# Patient Record
Sex: Female | Born: 1970
Health system: Southern US, Community
[De-identification: ages and names within clinical notes are randomized; demographics above are authoritative.]

## PROBLEM LIST (undated history)

## (undated) DIAGNOSIS — E669 Obesity, unspecified: Secondary | ICD-10-CM

## (undated) DIAGNOSIS — K219 Gastro-esophageal reflux disease without esophagitis: Secondary | ICD-10-CM

## (undated) DIAGNOSIS — C189 Malignant neoplasm of colon, unspecified: Secondary | ICD-10-CM

## (undated) DIAGNOSIS — F419 Anxiety disorder, unspecified: Secondary | ICD-10-CM

## (undated) DIAGNOSIS — R911 Solitary pulmonary nodule: Secondary | ICD-10-CM

## (undated) HISTORY — DX: Malignant neoplasm of colon, unspecified: C18.9

## (undated) HISTORY — PX: TUBAL LIGATION: SHX77

## (undated) HISTORY — PX: WISDOM TOOTH EXTRACTION: SHX21

## (undated) HISTORY — PX: COLONOSCOPY: SHX174

---

## 1998-07-26 ENCOUNTER — Ambulatory Visit (HOSPITAL_COMMUNITY): Admission: RE | Admit: 1998-07-26 | Discharge: 1998-07-26 | Payer: Self-pay | Admitting: *Deleted

## 1999-04-24 ENCOUNTER — Other Ambulatory Visit: Admission: RE | Admit: 1999-04-24 | Discharge: 1999-04-24 | Payer: Self-pay | Admitting: *Deleted

## 1999-09-04 ENCOUNTER — Other Ambulatory Visit: Admission: RE | Admit: 1999-09-04 | Discharge: 1999-09-04 | Payer: Self-pay | Admitting: Gynecology

## 2000-03-16 ENCOUNTER — Other Ambulatory Visit: Admission: RE | Admit: 2000-03-16 | Discharge: 2000-03-16 | Payer: Self-pay | Admitting: Obstetrics & Gynecology

## 2000-05-17 ENCOUNTER — Ambulatory Visit (HOSPITAL_COMMUNITY): Admission: RE | Admit: 2000-05-17 | Discharge: 2000-05-17 | Payer: Self-pay | Admitting: *Deleted

## 2000-05-17 ENCOUNTER — Encounter: Payer: Self-pay | Admitting: *Deleted

## 2000-06-14 ENCOUNTER — Ambulatory Visit (HOSPITAL_COMMUNITY): Admission: RE | Admit: 2000-06-14 | Discharge: 2000-06-14 | Payer: Self-pay | Admitting: *Deleted

## 2000-06-14 ENCOUNTER — Encounter: Payer: Self-pay | Admitting: *Deleted

## 2000-07-12 ENCOUNTER — Encounter: Payer: Self-pay | Admitting: Obstetrics and Gynecology

## 2000-07-12 ENCOUNTER — Ambulatory Visit (HOSPITAL_COMMUNITY): Admission: RE | Admit: 2000-07-12 | Discharge: 2000-07-12 | Payer: Self-pay | Admitting: Obstetrics and Gynecology

## 2000-08-10 ENCOUNTER — Ambulatory Visit (HOSPITAL_COMMUNITY): Admission: RE | Admit: 2000-08-10 | Discharge: 2000-08-10 | Payer: Self-pay | Admitting: *Deleted

## 2000-08-10 ENCOUNTER — Encounter: Payer: Self-pay | Admitting: *Deleted

## 2000-09-07 ENCOUNTER — Encounter (HOSPITAL_COMMUNITY): Admission: RE | Admit: 2000-09-07 | Discharge: 2000-09-27 | Payer: Self-pay | Admitting: *Deleted

## 2000-09-07 ENCOUNTER — Encounter: Payer: Self-pay | Admitting: *Deleted

## 2000-09-16 ENCOUNTER — Inpatient Hospital Stay (HOSPITAL_COMMUNITY): Admission: AD | Admit: 2000-09-16 | Discharge: 2000-09-16 | Payer: Self-pay | Admitting: *Deleted

## 2000-09-24 ENCOUNTER — Inpatient Hospital Stay (HOSPITAL_COMMUNITY): Admission: AD | Admit: 2000-09-24 | Discharge: 2000-09-29 | Payer: Self-pay | Admitting: Obstetrics and Gynecology

## 2001-07-12 ENCOUNTER — Other Ambulatory Visit: Admission: RE | Admit: 2001-07-12 | Discharge: 2001-07-12 | Payer: Self-pay | Admitting: *Deleted

## 2002-07-17 ENCOUNTER — Other Ambulatory Visit: Admission: RE | Admit: 2002-07-17 | Discharge: 2002-07-17 | Payer: Self-pay | Admitting: *Deleted

## 2003-07-16 ENCOUNTER — Other Ambulatory Visit: Admission: RE | Admit: 2003-07-16 | Discharge: 2003-07-16 | Payer: Self-pay | Admitting: *Deleted

## 2004-09-16 ENCOUNTER — Other Ambulatory Visit: Admission: RE | Admit: 2004-09-16 | Discharge: 2004-09-16 | Payer: Self-pay | Admitting: Obstetrics and Gynecology

## 2006-07-21 ENCOUNTER — Other Ambulatory Visit: Admission: RE | Admit: 2006-07-21 | Discharge: 2006-07-21 | Payer: Self-pay | Admitting: Obstetrics and Gynecology

## 2007-01-23 ENCOUNTER — Inpatient Hospital Stay (HOSPITAL_COMMUNITY): Admission: AD | Admit: 2007-01-23 | Discharge: 2007-01-23 | Payer: Self-pay | Admitting: Obstetrics and Gynecology

## 2007-02-17 ENCOUNTER — Inpatient Hospital Stay (HOSPITAL_COMMUNITY): Admission: AD | Admit: 2007-02-17 | Discharge: 2007-02-20 | Payer: Self-pay | Admitting: Obstetrics and Gynecology

## 2014-06-19 ENCOUNTER — Other Ambulatory Visit: Payer: Self-pay | Admitting: Obstetrics and Gynecology

## 2014-06-19 DIAGNOSIS — Z1231 Encounter for screening mammogram for malignant neoplasm of breast: Secondary | ICD-10-CM

## 2014-06-27 ENCOUNTER — Other Ambulatory Visit: Payer: Self-pay | Admitting: Obstetrics and Gynecology

## 2014-06-27 ENCOUNTER — Ambulatory Visit: Payer: Self-pay

## 2014-07-03 ENCOUNTER — Ambulatory Visit
Admission: RE | Admit: 2014-07-03 | Discharge: 2014-07-03 | Disposition: A | Discharge status: Home / Self Care | Payer: BC Managed Care – PPO | Source: Ambulatory Visit | Attending: Obstetrics and Gynecology | Admitting: Obstetrics and Gynecology

## 2014-07-03 ENCOUNTER — Encounter (INDEPENDENT_AMBULATORY_CARE_PROVIDER_SITE_OTHER): Payer: Self-pay

## 2014-07-03 DIAGNOSIS — Z1231 Encounter for screening mammogram for malignant neoplasm of breast: Secondary | ICD-10-CM

## 2016-07-13 DIAGNOSIS — Z6836 Body mass index (BMI) 36.0-36.9, adult: Secondary | ICD-10-CM | POA: Diagnosis not present

## 2016-07-13 DIAGNOSIS — Z01419 Encounter for gynecological examination (general) (routine) without abnormal findings: Secondary | ICD-10-CM | POA: Diagnosis not present

## 2016-07-13 DIAGNOSIS — Z1231 Encounter for screening mammogram for malignant neoplasm of breast: Secondary | ICD-10-CM | POA: Diagnosis not present

## 2016-09-11 DIAGNOSIS — Z23 Encounter for immunization: Secondary | ICD-10-CM | POA: Diagnosis not present

## 2017-07-14 DIAGNOSIS — Z1231 Encounter for screening mammogram for malignant neoplasm of breast: Secondary | ICD-10-CM | POA: Diagnosis not present

## 2017-07-14 DIAGNOSIS — Z01419 Encounter for gynecological examination (general) (routine) without abnormal findings: Secondary | ICD-10-CM | POA: Diagnosis not present

## 2017-07-14 DIAGNOSIS — R102 Pelvic and perineal pain: Secondary | ICD-10-CM | POA: Diagnosis not present

## 2017-07-14 DIAGNOSIS — N926 Irregular menstruation, unspecified: Secondary | ICD-10-CM | POA: Diagnosis not present

## 2017-09-08 DIAGNOSIS — Z23 Encounter for immunization: Secondary | ICD-10-CM | POA: Diagnosis not present

## 2017-12-29 DIAGNOSIS — R109 Unspecified abdominal pain: Secondary | ICD-10-CM | POA: Diagnosis not present

## 2017-12-30 DIAGNOSIS — N882 Stricture and stenosis of cervix uteri: Secondary | ICD-10-CM | POA: Diagnosis not present

## 2017-12-30 DIAGNOSIS — N939 Abnormal uterine and vaginal bleeding, unspecified: Secondary | ICD-10-CM | POA: Diagnosis not present

## 2017-12-30 DIAGNOSIS — N926 Irregular menstruation, unspecified: Secondary | ICD-10-CM | POA: Diagnosis not present

## 2018-02-04 DIAGNOSIS — N882 Stricture and stenosis of cervix uteri: Secondary | ICD-10-CM | POA: Diagnosis not present

## 2018-02-04 DIAGNOSIS — N926 Irregular menstruation, unspecified: Secondary | ICD-10-CM | POA: Diagnosis not present

## 2018-02-04 DIAGNOSIS — N939 Abnormal uterine and vaginal bleeding, unspecified: Secondary | ICD-10-CM | POA: Diagnosis not present

## 2018-02-07 ENCOUNTER — Other Ambulatory Visit: Payer: Self-pay | Admitting: Obstetrics and Gynecology

## 2018-02-07 DIAGNOSIS — N939 Abnormal uterine and vaginal bleeding, unspecified: Secondary | ICD-10-CM | POA: Diagnosis not present

## 2018-02-17 DIAGNOSIS — N882 Stricture and stenosis of cervix uteri: Secondary | ICD-10-CM | POA: Diagnosis not present

## 2018-02-17 DIAGNOSIS — N926 Irregular menstruation, unspecified: Secondary | ICD-10-CM | POA: Diagnosis not present

## 2018-02-17 DIAGNOSIS — N939 Abnormal uterine and vaginal bleeding, unspecified: Secondary | ICD-10-CM | POA: Diagnosis not present

## 2018-05-24 DIAGNOSIS — N939 Abnormal uterine and vaginal bleeding, unspecified: Secondary | ICD-10-CM | POA: Diagnosis not present

## 2018-05-24 DIAGNOSIS — N920 Excessive and frequent menstruation with regular cycle: Secondary | ICD-10-CM | POA: Diagnosis not present

## 2018-07-19 DIAGNOSIS — Z6837 Body mass index (BMI) 37.0-37.9, adult: Secondary | ICD-10-CM | POA: Diagnosis not present

## 2018-07-19 DIAGNOSIS — N939 Abnormal uterine and vaginal bleeding, unspecified: Secondary | ICD-10-CM | POA: Diagnosis not present

## 2018-07-19 DIAGNOSIS — Z01419 Encounter for gynecological examination (general) (routine) without abnormal findings: Secondary | ICD-10-CM | POA: Diagnosis not present

## 2018-09-01 DIAGNOSIS — Z23 Encounter for immunization: Secondary | ICD-10-CM | POA: Diagnosis not present

## 2018-09-07 DIAGNOSIS — Z1231 Encounter for screening mammogram for malignant neoplasm of breast: Secondary | ICD-10-CM | POA: Diagnosis not present

## 2019-08-04 DIAGNOSIS — Z6835 Body mass index (BMI) 35.0-35.9, adult: Secondary | ICD-10-CM | POA: Diagnosis not present

## 2019-08-04 DIAGNOSIS — Z124 Encounter for screening for malignant neoplasm of cervix: Secondary | ICD-10-CM | POA: Diagnosis not present

## 2019-08-04 DIAGNOSIS — Z1211 Encounter for screening for malignant neoplasm of colon: Secondary | ICD-10-CM | POA: Diagnosis not present

## 2019-08-04 DIAGNOSIS — Z1239 Encounter for other screening for malignant neoplasm of breast: Secondary | ICD-10-CM | POA: Diagnosis not present

## 2019-08-04 DIAGNOSIS — Z01419 Encounter for gynecological examination (general) (routine) without abnormal findings: Secondary | ICD-10-CM | POA: Diagnosis not present

## 2019-09-06 DIAGNOSIS — Z23 Encounter for immunization: Secondary | ICD-10-CM | POA: Diagnosis not present

## 2019-09-11 DIAGNOSIS — Z1231 Encounter for screening mammogram for malignant neoplasm of breast: Secondary | ICD-10-CM | POA: Diagnosis not present

## 2019-10-12 DIAGNOSIS — E669 Obesity, unspecified: Secondary | ICD-10-CM | POA: Diagnosis not present

## 2019-10-12 DIAGNOSIS — K625 Hemorrhage of anus and rectum: Secondary | ICD-10-CM | POA: Diagnosis not present

## 2019-10-12 DIAGNOSIS — K219 Gastro-esophageal reflux disease without esophagitis: Secondary | ICD-10-CM | POA: Diagnosis not present

## 2019-10-12 DIAGNOSIS — Z1211 Encounter for screening for malignant neoplasm of colon: Secondary | ICD-10-CM | POA: Diagnosis not present

## 2020-01-19 DIAGNOSIS — K625 Hemorrhage of anus and rectum: Secondary | ICD-10-CM | POA: Diagnosis not present

## 2020-01-19 DIAGNOSIS — C187 Malignant neoplasm of sigmoid colon: Secondary | ICD-10-CM | POA: Diagnosis not present

## 2020-01-19 DIAGNOSIS — D125 Benign neoplasm of sigmoid colon: Secondary | ICD-10-CM | POA: Diagnosis not present

## 2020-01-19 DIAGNOSIS — D122 Benign neoplasm of ascending colon: Secondary | ICD-10-CM | POA: Diagnosis not present

## 2020-01-19 DIAGNOSIS — K5730 Diverticulosis of large intestine without perforation or abscess without bleeding: Secondary | ICD-10-CM | POA: Diagnosis not present

## 2020-01-19 DIAGNOSIS — K635 Polyp of colon: Secondary | ICD-10-CM | POA: Diagnosis not present

## 2020-01-19 DIAGNOSIS — Z1211 Encounter for screening for malignant neoplasm of colon: Secondary | ICD-10-CM | POA: Diagnosis not present

## 2020-02-06 DIAGNOSIS — Z86010 Personal history of colonic polyps: Secondary | ICD-10-CM | POA: Diagnosis not present

## 2020-02-06 DIAGNOSIS — K5730 Diverticulosis of large intestine without perforation or abscess without bleeding: Secondary | ICD-10-CM | POA: Diagnosis not present

## 2020-02-20 DIAGNOSIS — C187 Malignant neoplasm of sigmoid colon: Secondary | ICD-10-CM | POA: Diagnosis not present

## 2020-02-22 ENCOUNTER — Other Ambulatory Visit: Payer: Self-pay | Admitting: Surgery

## 2020-02-22 DIAGNOSIS — C187 Malignant neoplasm of sigmoid colon: Secondary | ICD-10-CM

## 2020-02-29 ENCOUNTER — Other Ambulatory Visit: Payer: Self-pay | Admitting: Surgery

## 2020-02-29 DIAGNOSIS — C187 Malignant neoplasm of sigmoid colon: Secondary | ICD-10-CM

## 2020-03-08 ENCOUNTER — Other Ambulatory Visit: Payer: Self-pay

## 2020-03-08 ENCOUNTER — Ambulatory Visit
Admission: RE | Admit: 2020-03-08 | Discharge: 2020-03-08 | Disposition: A | Discharge status: Home / Self Care | Payer: BC Managed Care – PPO | Source: Ambulatory Visit | Attending: Surgery | Admitting: Surgery

## 2020-03-08 DIAGNOSIS — C187 Malignant neoplasm of sigmoid colon: Secondary | ICD-10-CM

## 2020-03-08 DIAGNOSIS — D259 Leiomyoma of uterus, unspecified: Secondary | ICD-10-CM | POA: Diagnosis not present

## 2020-03-08 DIAGNOSIS — C189 Malignant neoplasm of colon, unspecified: Secondary | ICD-10-CM | POA: Diagnosis not present

## 2020-03-08 DIAGNOSIS — R911 Solitary pulmonary nodule: Secondary | ICD-10-CM | POA: Diagnosis not present

## 2020-03-08 MED ORDER — IOPAMIDOL (ISOVUE-300) INJECTION 61%
100.0000 mL | Freq: Once | INTRAVENOUS | Status: AC | PRN
  Administered 2020-03-08: 100 mL via INTRAVENOUS

## 2020-03-21 ENCOUNTER — Other Ambulatory Visit: Payer: Self-pay | Admitting: Surgery

## 2020-03-21 ENCOUNTER — Other Ambulatory Visit (HOSPITAL_COMMUNITY): Payer: Self-pay | Admitting: Surgery

## 2020-03-21 DIAGNOSIS — C187 Malignant neoplasm of sigmoid colon: Secondary | ICD-10-CM

## 2020-03-22 ENCOUNTER — Telehealth: Payer: Self-pay | Admitting: Hematology

## 2020-03-22 NOTE — Telephone Encounter (Signed)
Received a new pt referral from Dr. Dema Severin at [facility] for colon cancer. Amanda Bernard has been cld and scheduled to see Dr. Burr Medico on 4/22 at 3pm. Pt aware to arrive 15 minutes early.   Tc to Kim at [facility] to obtain colonoscopy, path and other info received from Dr. Lorie Apley office. Maudie Mercury will fax over the information.

## 2020-03-25 ENCOUNTER — Other Ambulatory Visit: Payer: Self-pay | Admitting: Surgery

## 2020-03-25 DIAGNOSIS — C187 Malignant neoplasm of sigmoid colon: Secondary | ICD-10-CM

## 2020-03-25 NOTE — Progress Notes (Signed)
Spoke with patient introduced myself as GI nurse navigator and explained my role.  She is aware of her upcoming appointment with Dr. Burr Medico on 4/22 at 3:00 pm.  I asked her to arrive at least 15 minutes early for the registration process.  I told her she is allowed to bring one support person with her to her appointment. She is having a PET scan at 1:00 pm prior to seeing Dr. Burr Medico.  She is very worried at this point as she was told there is something in her lung.  Her husband died about 18 years ago with lung cancer.  At that time they had twin babies at home.  I explained to her the PET scan will let us know if there is any cancer anywhere else and I will meet with her at her appointment on Thursday.  She verbalized an understanding.

## 2020-03-27 ENCOUNTER — Ambulatory Visit (HOSPITAL_COMMUNITY)
Admission: RE | Admit: 2020-03-27 | Discharge: 2020-03-27 | Disposition: A | Discharge status: Home / Self Care | Payer: BC Managed Care – PPO | Source: Ambulatory Visit | Attending: Surgery | Admitting: Surgery

## 2020-03-27 ENCOUNTER — Other Ambulatory Visit: Payer: Self-pay

## 2020-03-27 DIAGNOSIS — C187 Malignant neoplasm of sigmoid colon: Secondary | ICD-10-CM | POA: Insufficient documentation

## 2020-03-27 LAB — GLUCOSE, CAPILLARY: Glucose-Capillary: 125 mg/dL — ABNORMAL HIGH (ref 70–99)

## 2020-03-27 MED ORDER — FLUDEOXYGLUCOSE F - 18 (FDG) INJECTION
10.4600 | Freq: Once | INTRAVENOUS | Status: AC | PRN
  Administered 2020-03-27: 10.46 via INTRAVENOUS

## 2020-03-28 ENCOUNTER — Inpatient Hospital Stay: Payer: BC Managed Care – PPO | Attending: Hematology | Admitting: Hematology

## 2020-03-28 ENCOUNTER — Encounter: Payer: Self-pay | Admitting: Hematology

## 2020-03-28 ENCOUNTER — Inpatient Hospital Stay: Payer: BC Managed Care – PPO | Admitting: Hematology

## 2020-03-28 ENCOUNTER — Inpatient Hospital Stay: Payer: BC Managed Care – PPO

## 2020-03-28 ENCOUNTER — Other Ambulatory Visit: Payer: Self-pay

## 2020-03-28 ENCOUNTER — Encounter (HOSPITAL_COMMUNITY): Admission: RE | Admit: 2020-03-28 | Payer: BC Managed Care – PPO | Source: Ambulatory Visit

## 2020-03-28 DIAGNOSIS — C187 Malignant neoplasm of sigmoid colon: Secondary | ICD-10-CM | POA: Diagnosis not present

## 2020-03-28 DIAGNOSIS — Z87891 Personal history of nicotine dependence: Secondary | ICD-10-CM | POA: Diagnosis not present

## 2020-03-28 DIAGNOSIS — R911 Solitary pulmonary nodule: Secondary | ICD-10-CM | POA: Insufficient documentation

## 2020-03-28 LAB — CEA (IN HOUSE-CHCC): CEA (CHCC-In House): <1 ng/mL (ref 0.00–5.00)

## 2020-03-28 NOTE — Progress Notes (Signed)
Met with patient at her initial medical oncology appointment with Dr. Burr Medico.  I had spoken with her previously on the phone.  She was given my business card with my direct phone number and encouraged to call with any questions or concerns.  I explained my role as GI nurse navigator.  Her questions were answered.

## 2020-03-28 NOTE — Progress Notes (Signed)
[facility]   [phone] Fax:([phone]) [place] Note   Patient Care Team: Patient, No Pcp Per as PCP - General (General Practice) Jonnie Finner, RN as Oncology Nurse Navigator (Oncology) Truitt Merle, MD as Consulting Physician (Hematology)  Date of Service:  03/28/2020   CHIEF COMPLAINTS/PURPOSE OF CONSULTATION:  Newly Diagnosed Sigmoid colon cancer and right lung nodule   REFERRING PHYSICIAN:  Dr Collene Mares  Oncology History Overview Note  Cancer Staging Cancer of sigmoid colon [facility]) Staging form: Colon and Rectum, AJCC 8th Edition - Clinical stage from 02/13/2020: Stage I (cT1, cN0, cM0) - Signed by Truitt Merle, MD on 03/28/2020    Cancer of sigmoid colon ([place])  01/19/2020 Procedure   Colonoscopy by Dr Collene Mares 01/19/20 IMPRESSION -One 26mm polyp in the sigmoid colon, removed with a hot snare X2 after injecting the stalk with 4cc of epinephrine, resected and retrieved. 2 Clips were placed ot control post polypectomy bleeding.  -One small polyp in the proximal ascending colon was removed by cold biopsies.  -few small diverticula in the sigmoid colon.    01/19/2020 Initial Biopsy   Final Diagnoses 01/19/20 A. Colon, Ascending, Polyp, Polypectomy:  -FRAGMENTS OF TUBULAR ADENOMA.   -NO HIGH GRADE DYSPLASIA OR MALIGNANCY B. Colon, sigmoid, polyp, Polupectomy:   -INVASIVE ADENOCARCINOMA, WELL DIFFERENTIATES, ARISING IN AN ADENOMA. See comment.    02/13/2020 Cancer Staging   Staging form: Colon and Rectum, AJCC 8th Edition - Clinical stage from 02/13/2020: Stage I (cT1, cN0, cM0) - Signed by Truitt Merle, MD on 03/28/2020   03/08/2020 Imaging   CT CAP w contrast 03/08/20 IMPRESSION: 1. 15 mm right lower lobe pulmonary nodule is indeterminate. A solitary pulmonary metastatic focus is possible but probably unlikely. PET-CT versus follow-up chest CT in 3-4 months. 2. No mediastinal or hilar mass or adenopathy. 3. No obvious colon lesion. 4. No findings  for metastatic disease involving the abdomen/pelvis. 5. Uterine fibroid. 6. Bilateral pars defects at L5 with a grade 1 spondylolisthesis. There also appears to be a ununited right pedicle fracture at L5.   03/27/2020 PET scan   IMPRESSION: 1. The right lower lobe lung nodule has low-grade metabolic activity about the level of the blood pool, maximum SUV 3.0. Low-grade malignancy such as neuroendocrine tumor not excluded. 2. No significant activity in the sigmoid colon region of the liver, or retroperitoneum. Given this lack of evidence of spread, I am skeptical that the right lower lobe nodule is due to metastatic disease from the sigmoid colon lesion. 3. Other imaging findings of potential clinical significance: Mild chronic left maxillary sinusitis. Aortic Atherosclerosis (ICD10-I70.0). 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   03/28/2020 Initial Diagnosis   Cancer of sigmoid colon ([place])      HISTORY OF PRESENTING ILLNESS:  Amanda Bernard 49 y.o. female is a here because of newly diagnosed sigmoid colon cancer. The patient was referred by Dr Collene Mares. The patient presents to the clinic today alone.   Her cancer was found by her initial screening colonoscopy. She notes rare rectal bleeding over he past several years. She denies pain with BM and had stable regular BM. She denies abdominal bloating. She did have chronic acid reflex since her first pregnancy. She notes she is eating adequately and has stable weight. She denied vision, cough, SOB, chest pain, or LE edema or significant pain. She notes she has  been anxious lately about her diagnosis.   Socially has a long term significant other with a set of twins and a teenager. She works as an Optometrist. She drinks at least 2 beer a day, about 20 cans in a week. She quit smoking 9 years ago after smoking for  25 years 1-1.5ppd. She still uses Vape with Nicoteine occasionally.   She does not have any significant medical history. Her only surgery is C-sections. She notes her maternal grandmother had ovarian cancer in early 104s. Her paternal grandfather had lung cancer and her paternal uncle had brain cancer. Her son had testicular cancer at 42 months old.  She notes she had a hard fall on her coccyx when she was 12 and had concern if her lumbar spine fracture was related to this or if this was genetic. She will only have lower back pain with standing or walking for long time. She notes her periods or sporadic from monthly to several months.     REVIEW OF SYSTEMS:    Constitutional: Denies fevers, chills or abnormal night sweats Eyes: Denies blurriness of vision, double vision or watery eyes Ears, nose, mouth, throat, and face: Denies mucositis or sore throat Respiratory: Denies cough, dyspnea or wheezes Cardiovascular: Denies palpitation, chest discomfort or lower extremity swelling Gastrointestinal:  Denies nausea, heartburn or change in bowel habits Skin: Denies abnormal skin rashes Lymphatics: Denies new lymphadenopathy or easy bruising Neurological:Denies numbness, tingling or new weaknesses Behavioral/Psych: Mood is stable, no new changes  All other systems were reviewed with the patient and are negative.   MEDICAL HISTORY:  History reviewed. No pertinent past medical history.  SURGICAL HISTORY: History reviewed. No pertinent surgical history.  SOCIAL HISTORY: Social History   Socioeconomic History  . Marital status: Widowed    Spouse name: Not on file  . Number of children: 3  . Years of education: Not on file  . Highest education level: Not on file  Occupational History  . Not on file  Tobacco Use  . Smoking status: Former Smoker    Packs/day: 1.00    Years: 25.00    Pack years: 25.00    Quit date: 12/07/2009    Years since quitting: 10.3  . Smokeless tobacco: Current User    Substance and Sexual Activity  . Alcohol use: Yes    Alcohol/week: 20.0 standard drinks    Types: 20 Cans of beer per week    Comment: for 3 years   . Drug use: Not on file  . Sexual activity: Yes  Other Topics Concern  . Not on file  Social History Narrative  . Not on file   Social Determinants of Health   Financial Resource Strain:   . Difficulty of Paying Living Expenses:   Food Insecurity:   . Worried About Charity fundraiser in the Last Year:   . Arboriculturist in the Last Year:   Transportation Needs:   . Film/video editor (Medical):   Marland Kitchen Lack of Transportation (Non-Medical):   Physical Activity:   . Days of Exercise per Week:   . Minutes of Exercise per Session:   Stress:   . Feeling of Stress :   Social Connections:   . Frequency of Communication with Friends and Family:   . Frequency of Social Gatherings with Friends and Family:   . Attends Religious Services:   . Active Member of Clubs or Organizations:   . Attends Archivist Meetings:   Marland Kitchen Marital Status:  Intimate Partner Violence:   . Fear of Current or Ex-Partner:   . Emotionally Abused:   Marland Kitchen Physically Abused:   . Sexually Abused:     FAMILY HISTORY: Family History  Problem Relation Age of Onset  . Cancer Paternal Uncle        brain cancer   . Cancer Maternal Grandmother 40       ovarian cancer   . Cancer Paternal Grandfather        lung cancer  . Cancer Child 1       testicular cancer     ALLERGIES:  has no allergies on file.  MEDICATIONS:  Current Outpatient Medications  Medication Sig Dispense Refill  . B Complex Vitamins (VITAMIN B COMPLEX 100 IJ) vitamin B complex    . Calcium Carb-Cholecalciferol (CALCIUM 1000 + D PO) Calcium With Vitamin D3    . ibuprofen (ADVIL) 600 MG tablet ibuprofen 600 mg tablet    . influenza vac split quadrivalent PF (FLUARIX) 0.5 ML injection Afluria Quad 2018-2019 (PF) 60 mcg (15 mcg x 4)/0.5 mL IM syringe  ADM 0.5ML IM UTD    . influenza  vaccine (FLUCELVAX QUADRIVALENT) 0.5 ML injection Flucelvax Quad 2020-2021 (PF) 60 mcg (15 mcg x 4)/0.5 mL IM syringe  ADM 0.5ML IM UTD    . Loratadine (CLARITIN) 10 MG CAPS Claritin    . Multiple Vitamins-Minerals (HAIR SKIN AND NAILS FORMULA PO) Hair,Skin and Nails    . Multiple Vitamins-Minerals (MULTIVITAMIN ADULT EXTRA C PO) multivitamin    . Omega-3 Fatty Acids (FISH OIL) 1000 MG CAPS Fish Oil    . Omeprazole (PRILOSEC PO) omeprazole    . Probiotic Product (PROBIOTIC-10 PO) Probiotic    . triamcinolone ointment (KENALOG) 0.5 % triamcinolone acetonide 0.5 % topical ointment     No current facility-administered medications for this visit.    PHYSICAL EXAMINATION: ECOG PERFORMANCE STATUS: 0 - Asymptomatic  Vitals:   03/28/20 1224  BP: (!) 172/99  Pulse: 69  Resp: 18  Temp: 98 F (36.7 C)  SpO2: 100%   Filed Weights   03/28/20 1224  Weight: 215 lb 4.8 oz (97.7 kg)    GENERAL:alert, no distress and comfortable SKIN: skin color, texture, turgor are normal, no rashes or significant lesions EYES: normal, Conjunctiva are pink and non-injected, sclera clear  NECK: supple, thyroid normal size, non-tender, without nodularity LYMPH:  no palpable lymphadenopathy in the cervical, axillary  LUNGS: clear to auscultation and percussion with normal breathing effort HEART: regular rate & rhythm and no murmurs and no lower extremity edema ABDOMEN:abdomen soft, non-tender and normal bowel sounds Musculoskeletal:no cyanosis of digits and no clubbing  NEURO: alert & oriented x 3 with fluent speech, no focal motor/sensory deficits  LABORATORY DATA:  I have reviewed the data as listed No flowsheet data found.  No flowsheet data found.   RADIOGRAPHIC STUDIES: I have personally reviewed the radiological images as listed and agreed with the findings in the report. CT CHEST W CONTRAST  Result Date: 03/08/2020 CLINICAL DATA:  Recent diagnosis of cancerous colon polyp at colonoscopy. Staging  examination. EXAM: CT CHEST, ABDOMEN, AND PELVIS WITH CONTRAST TECHNIQUE: Multidetector CT imaging of the chest, abdomen and pelvis was performed following the standard protocol during bolus administration of intravenous contrast. CONTRAST:  182mL ISOVUE-300 IOPAMIDOL (ISOVUE-300) INJECTION 61% COMPARISON:  None. FINDINGS: CT CHEST FINDINGS Cardiovascular: The heart is normal in size. No pericardial effusion. The aorta is normal in caliber. No dissection. No atherosclerotic calcifications. No definite coronary artery calcifications.  Mediastinum/Nodes: No mediastinal or hilar mass or lymphadenopathy. The esophagus is grossly normal. Lungs/Pleura: There is a smoothly marginated but slightly lobulated 15 mm nodule at the right lung base which is an indeterminate finding but could not exclude a solitary pulmonary metastasis. I do not see any other pulmonary nodules or worrisome pulmonary lesions. There is a streaky band of right middle lobe atelectasis medially. No infiltrates or effusions. The tracheobronchial tree is unremarkable. Musculoskeletal: No breast masses, supraclavicular or axillary adenopathy. The thyroid gland appears normal except for a small calcification. The bony thorax is intact. No worrisome bone lesions. CT ABDOMEN PELVIS FINDINGS Hepatobiliary: No hepatic lesions to suggest hepatic metastatic disease. The gallbladder is normal. No common bile duct dilatation. Pancreas: No mass, inflammation or ductal dilatation. Spleen: Normal size. No focal lesions. Adrenals/Urinary Tract: The adrenal glands and kidneys are unremarkable. No renal, ureteral or bladder calculi or mass. Stomach/Bowel: The stomach, duodenum, small bowel and colon are unremarkable. No acute inflammatory changes, mass lesions or obstructive findings. The terminal ileum is normal. Vascular/Lymphatic: The aorta is normal in caliber. No dissection. The branch vessels are patent. The major venous structures are patent. No mesenteric or  retroperitoneal mass or adenopathy. Small scattered lymph nodes are noted. Reproductive: Mild retroflexion of the uterus. A posterior myometrial fibroid is noted. The ovaries are unremarkable. Other: No pelvic mass or adenopathy. No free pelvic fluid collections. No inguinal mass or adenopathy. No abdominal wall hernia or subcutaneous lesions. Musculoskeletal: Bilateral pars defects are noted at L5 with a grade 1 spondylolisthesis. There also appears to be a ununited right pedicle fracture at L5. No worrisome bone lesions. IMPRESSION: 1. 15 mm right lower lobe pulmonary nodule is indeterminate. A solitary pulmonary metastatic focus is possible but probably unlikely. PET-CT versus follow-up chest CT in 3-4 months. 2. No mediastinal or hilar mass or adenopathy. 3. No obvious colon lesion. 4. No findings for metastatic disease involving the abdomen/pelvis. 5. Uterine fibroid. 6. Bilateral pars defects at L5 with a grade 1 spondylolisthesis. There also appears to be a ununited right pedicle fracture at L5. Electronically Signed   By: Marijo Sanes M.D.   On: 03/08/2020 16:25   CT ABDOMEN PELVIS W CONTRAST  Result Date: 03/08/2020 CLINICAL DATA:  Recent diagnosis of cancerous colon polyp at colonoscopy. Staging examination. EXAM: CT CHEST, ABDOMEN, AND PELVIS WITH CONTRAST TECHNIQUE: Multidetector CT imaging of the chest, abdomen and pelvis was performed following the standard protocol during bolus administration of intravenous contrast. CONTRAST:  150mL ISOVUE-300 IOPAMIDOL (ISOVUE-300) INJECTION 61% COMPARISON:  None. FINDINGS: CT CHEST FINDINGS Cardiovascular: The heart is normal in size. No pericardial effusion. The aorta is normal in caliber. No dissection. No atherosclerotic calcifications. No definite coronary artery calcifications. Mediastinum/Nodes: No mediastinal or hilar mass or lymphadenopathy. The esophagus is grossly normal. Lungs/Pleura: There is a smoothly marginated but slightly lobulated 15 mm nodule  at the right lung base which is an indeterminate finding but could not exclude a solitary pulmonary metastasis. I do not see any other pulmonary nodules or worrisome pulmonary lesions. There is a streaky band of right middle lobe atelectasis medially. No infiltrates or effusions. The tracheobronchial tree is unremarkable. Musculoskeletal: No breast masses, supraclavicular or axillary adenopathy. The thyroid gland appears normal except for a small calcification. The bony thorax is intact. No worrisome bone lesions. CT ABDOMEN PELVIS FINDINGS Hepatobiliary: No hepatic lesions to suggest hepatic metastatic disease. The gallbladder is normal. No common bile duct dilatation. Pancreas: No mass, inflammation or ductal dilatation. Spleen: Normal  size. No focal lesions. Adrenals/Urinary Tract: The adrenal glands and kidneys are unremarkable. No renal, ureteral or bladder calculi or mass. Stomach/Bowel: The stomach, duodenum, small bowel and colon are unremarkable. No acute inflammatory changes, mass lesions or obstructive findings. The terminal ileum is normal. Vascular/Lymphatic: The aorta is normal in caliber. No dissection. The branch vessels are patent. The major venous structures are patent. No mesenteric or retroperitoneal mass or adenopathy. Small scattered lymph nodes are noted. Reproductive: Mild retroflexion of the uterus. A posterior myometrial fibroid is noted. The ovaries are unremarkable. Other: No pelvic mass or adenopathy. No free pelvic fluid collections. No inguinal mass or adenopathy. No abdominal wall hernia or subcutaneous lesions. Musculoskeletal: Bilateral pars defects are noted at L5 with a grade 1 spondylolisthesis. There also appears to be a ununited right pedicle fracture at L5. No worrisome bone lesions. IMPRESSION: 1. 15 mm right lower lobe pulmonary nodule is indeterminate. A solitary pulmonary metastatic focus is possible but probably unlikely. PET-CT versus follow-up chest CT in 3-4 months. 2.  No mediastinal or hilar mass or adenopathy. 3. No obvious colon lesion. 4. No findings for metastatic disease involving the abdomen/pelvis. 5. Uterine fibroid. 6. Bilateral pars defects at L5 with a grade 1 spondylolisthesis. There also appears to be a ununited right pedicle fracture at L5. Electronically Signed   By: Marijo Sanes M.D.   On: 03/08/2020 16:25   NM PET Image Initial (PI) Skull Base To Thigh  Result Date: 03/27/2020 CLINICAL DATA:  Initial treatment strategy for sigmoid colon cancer. Lung nodule on prior CT. EXAM: NUCLEAR MEDICINE PET SKULL BASE TO THIGH TECHNIQUE: 10.5 mCi F-18 FDG was injected intravenously. Full-ring PET imaging was performed from the skull base to thigh after the radiotracer. CT data was obtained and used for attenuation correction and anatomic localization. Fasting blood glucose: 125 mg/dl COMPARISON:  CT scan 03/08/2020 FINDINGS: Mediastinal blood pool activity: SUV max 3.0 Liver activity: SUV max N/A NECK: Accentuated activity in the palatine tonsils without appreciable associated CT abnormality, maximum SUV 8.8 on the right and 5.9 the left. Physiologic glottic activity in physiologic activity at the tongue base. Incidental CT findings: Mild chronic left maxillary sinusitis. CHEST: Thin left axillary lymph node 5 mm in parenchymal thickness, maximum SUV 5.0. The patient received COVID vaccination in the left arm last week and this is likely the cause for this mildly accentuated axillary nodal activity. The 1.6 cm right basilar pulmonary nodule has only low-grade activity with a maximum SUV of 3.0. Incidental CT findings: Unremarkable ABDOMEN/PELVIS: No hypermetabolic activity in the sigmoid colon is identified. No hypermetabolic adenopathy, hypermetabolic liver lesion, or other specific findings for metastatic disease in the abdomen/pelvis. Incidental CT findings: Mild abdominal aortic atherosclerotic calcification. Retroverted uterus. SKELETON: No significant abnormal  hypermetabolic activity in this region. 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 There 9 mm of anterolisthesis at L4-5 associated with a chronic left-sided L4 pars defect, and a chronic nonunited fracture or failure of ossification involving the right L4 pedicle. Endplate sclerosis at 075-GRM. IMPRESSION: 1. The right lower lobe lung nodule has low-grade metabolic activity about the level of the blood pool, maximum SUV 3.0. Low-grade malignancy such as neuroendocrine tumor not excluded. 2. No significant activity in the sigmoid colon region of the liver, or retroperitoneum. Given this lack of evidence of spread, I am skeptical that the right lower lobe nodule is due to metastatic disease from the sigmoid colon lesion. 3. Other imaging findings of potential clinical significance:  Mild chronic left maxillary sinusitis. Aortic Atherosclerosis (ICD10-I70.0). 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 Electronically Signed   By: Van Clines M.D.   On: 03/27/2020 12:36    ASSESSMENT & PLAN:  Amanda Bernard is a 50 y.o. Caucasian female with   1. Sigmoid Colon Cancer, cT2N0M0, Stage I -I personally reviewed her colonoscopy report, CT and PET image and colonosocpy biopsy results with her in great detail. Images reviewed in person with pt. She was found to have a 3cm polyp in sigmoid colon during her initial screening colonoscopy. It was removed and path showed well differentiated invasive adenocarcinoma involving submucusa.margins were negative (closest 53mm). Her CT scan showed no LN involvement or other definitive evidence of metastasis except an indeterminate 1.5cm RLL lung nodeule .  -I discussed her polypectomy was dose in multiple pieces, but by PET showed no indication of uptake or residual disease in that area.  -I discussed standard care  for stage I colon cancer is surgery to remove her sigmoid colon and surrounding LNs which I recommend.  -Given her early stage disease,negative margins and negative PET there is a low risk of residual microscopic disease or future recurence. Forgoing surgery to proceed with cancer surveillance is also an option with repeated scans and Colonoscopy for close monitoring.  -I will review her case with GI Tumor Board next week about lung biopsy and colon surgery. I answered all her questions to her understanding and satisfaction.  -Will obtain tumor Marker for baseline today. Physical exam negative today  -F/u as needed if she will f/u with Dr. Dema Severin or Collene Mares    2. Former smoker, Right Lung Nodule  -She quit smoking 9 years ago after smoking for 25 years 1-1.5ppd. She still uses Vape with Nicoteine occasionally.  -I have reviewed health risks and complications from smoking and discussed and advised complete smoking cessation.  -Her CT and PET scan in 03/2020 showed 1.5cm right lower lobe nodule with low grade metabolic activity. The lesion is solid and round, not spiculate, I have low suspicion this is related to her colon cancer. At this size, her lung nodule could be slow growing tumor such as neuroendocrine tumor or benign nodule, but there is no prior scan to compare. We can do repeat CT chest to monitor this in 3-6 months.  -I discussed biopsy could give definitive diagnosis, but the location may not be easy for biopsy. I will consult IR or pulmonologist about this. She is interested in biopsy.    3. Genetic Testing, Cancer Screening  -She has a family history of ovarian cancer (MGM in 33s), lung cancer (PGF), brain cancer (Pat, Uncle), testicular cancer (Her son at 44 months old).  -I will check with our genetic Counselor to see if her Genetic testing could be covered.  -I also encouraged her to continue routine Pap smear and yearly mammograms    PLAN:  -Baseline labs today for CEA -GI and  thoracic tumor board discussion  -F/u open    Orders Placed This Encounter  Procedures  . [facility] (IN HOUSE-[facility])    Standing Status:   Future    Number of Occurrences:   1    Standing Expiration Date:   03/28/2021    All questions were answered. The patient knows to call the clinic with any problems, questions or concerns. The total time spent in the appointment was 50 minutes.  Truitt Merle, MD 03/28/2020 11:03 PM  I, Joslyn Devon, am acting as scribe for Truitt Merle, MD.   I have reviewed the above documentation for accuracy and completeness, and I agree with the above.

## 2020-03-29 ENCOUNTER — Telehealth: Payer: Self-pay | Admitting: Hematology

## 2020-03-29 ENCOUNTER — Telehealth: Payer: Self-pay

## 2020-03-29 NOTE — Telephone Encounter (Signed)
Dr. Ernestina Penna noted dated 03/28/2020 faxed to Dr. Collene Mares at [phone]

## 2020-03-29 NOTE — Telephone Encounter (Signed)
No 4/22 los. No changes made to pt's schedule.  

## 2020-04-03 ENCOUNTER — Other Ambulatory Visit: Payer: Self-pay

## 2020-04-04 ENCOUNTER — Encounter: Payer: Self-pay | Admitting: *Deleted

## 2020-04-04 ENCOUNTER — Other Ambulatory Visit: Payer: Self-pay | Admitting: *Deleted

## 2020-04-04 ENCOUNTER — Other Ambulatory Visit: Payer: Self-pay

## 2020-04-04 DIAGNOSIS — R911 Solitary pulmonary nodule: Secondary | ICD-10-CM

## 2020-04-04 DIAGNOSIS — C187 Malignant neoplasm of sigmoid colon: Secondary | ICD-10-CM

## 2020-04-04 NOTE — Progress Notes (Signed)
I contacted Dr. Juline Patch assistant that patient has been referred to him for bx via bronchoscopy.

## 2020-04-04 NOTE — Progress Notes (Signed)
Spoke with patient per Dr. Burr Medico to let her know she was discussed in thoracic tumor board this morning.  The recommendation is to biopsy the lung nodule.  She has made a referral to Dr. Valeta Harms at [facility] to have this done.  She verbalized an understanding and will await to hear from his office.

## 2020-04-04 NOTE — Progress Notes (Signed)
The proposed treatment discussed in cancer conference 04/04/20 is for discussion purpose only and is not a binding recommendation.  The patient was not physically examined nor present for their treatment options.  Therefore, final treatment plans cannot be decided.  

## 2020-04-11 DIAGNOSIS — H6123 Impacted cerumen, bilateral: Secondary | ICD-10-CM | POA: Diagnosis not present

## 2020-04-17 ENCOUNTER — Encounter: Payer: Self-pay | Admitting: Pulmonary Disease

## 2020-04-17 ENCOUNTER — Other Ambulatory Visit: Payer: Self-pay

## 2020-04-17 ENCOUNTER — Telehealth: Payer: Self-pay | Admitting: Pulmonary Disease

## 2020-04-17 ENCOUNTER — Ambulatory Visit: Payer: BC Managed Care – PPO | Admitting: Pulmonary Disease

## 2020-04-17 VITALS — BP 142/82 | HR 69 | Temp 96.2°F | Ht 65.5 in | Wt 213.8 lb

## 2020-04-17 DIAGNOSIS — R942 Abnormal results of pulmonary function studies: Secondary | ICD-10-CM | POA: Diagnosis not present

## 2020-04-17 DIAGNOSIS — R911 Solitary pulmonary nodule: Secondary | ICD-10-CM

## 2020-04-17 DIAGNOSIS — C187 Malignant neoplasm of sigmoid colon: Secondary | ICD-10-CM | POA: Diagnosis not present

## 2020-04-17 NOTE — Progress Notes (Signed)
Synopsis: Referred in May 2021 for pulmonary nodule by Garner Nash, DO  Subjective:   PATIENT ID: Amanda Bernard GENDER: female DOB: May 14, 1971, MRN: [id number]  Chief Complaint  Patient presents with  . Consult    This is a 49 year old female, past medical history of sigmoid colon cancer and newly diagnosed right lung nodule.  Patient underwent biopsies and February 2021 which revealed an invasive adenocarcinoma.  Patient's images were reviewed at medical thoracic oncology conference and decision was made for biopsy of the lower lobe lung nodule.  It did have low-level SUV uptake on PET imaging.  Patient was referred to pulmonary for navigational bronchoscopy evaluation.  OV 04/17/2020: Patient with no respiratory complaints today.  Little anxious about everything that has been going on recently.  Is aware that she was referred here for bronchoscopy and biopsy.  We discussed this today in the office with detail.  Patient is a non-smoker.   Past Medical History:  Diagnosis Date  . Colon cancer [facility])      Family History  Problem Relation Age of Onset  . Cancer Paternal Uncle        brain cancer   . Cancer Maternal Grandmother 40       ovarian cancer   . Cancer Paternal Grandfather        lung cancer  . Cancer Child 1       testicular cancer      History reviewed. No pertinent surgical history.  Social History   Socioeconomic History  . Marital status: Widowed    Spouse name: Not on file  . Number of children: 3  . Years of education: Not on file  . Highest education level: Not on file  Occupational History  . Not on file  Tobacco Use  . Smoking status: Former Smoker    Packs/day: 1.00    Years: 25.00    Pack years: 25.00    Quit date: 12/07/2009    Years since quitting: 10.3  . Smokeless tobacco: Current User  Substance and Sexual Activity  . Alcohol use: Yes    Alcohol/week: 20.0 standard drinks    Types: 20 Cans of beer per week    Comment: for 3 years    . Drug use: Not on file  . Sexual activity: Yes  Other Topics Concern  . Not on file  Social History Narrative  . Not on file   Social Determinants of Health   Financial Resource Strain:   . Difficulty of Paying Living Expenses:   Food Insecurity:   . Worried About Charity fundraiser in the Last Year:   . Arboriculturist in the Last Year:   Transportation Needs:   . Film/video editor (Medical):   Marland Kitchen Lack of Transportation (Non-Medical):   Physical Activity:   . Days of Exercise per Week:   . Minutes of Exercise per Session:   Stress:   . Feeling of Stress :   Social Connections:   . Frequency of Communication with Friends and Family:   . Frequency of Social Gatherings with Friends and Family:   . Attends Religious Services:   . Active Member of Clubs or Organizations:   . Attends Archivist Meetings:   Marland Kitchen Marital Status:   Intimate Partner Violence:   . Fear of Current or Ex-Partner:   . Emotionally Abused:   Marland Kitchen Physically Abused:   . Sexually Abused:      No Known Allergies  Outpatient Medications Prior to Visit  Medication Sig Dispense Refill  . B Complex Vitamins (VITAMIN B COMPLEX 100 IJ) vitamin B complex    . Calcium Carb-Cholecalciferol (CALCIUM 1000 + D PO) Calcium With Vitamin D3    . ibuprofen (ADVIL) 600 MG tablet ibuprofen 600 mg tablet    . influenza vaccine (FLUCELVAX QUADRIVALENT) 0.5 ML injection Flucelvax Quad 2020-2021 (PF) 60 mcg (15 mcg x 4)/0.5 mL IM syringe  ADM 0.5ML IM UTD    . Loratadine (CLARITIN) 10 MG CAPS Claritin    . Multiple Vitamins-Minerals (HAIR SKIN AND NAILS FORMULA PO) Hair,Skin and Nails    . Multiple Vitamins-Minerals (MULTIVITAMIN ADULT EXTRA C PO) multivitamin    . Omega-3 Fatty Acids (FISH OIL) 1000 MG CAPS Fish Oil    . Probiotic Product (PROBIOTIC-10 PO) Probiotic    . triamcinolone ointment (KENALOG) 0.5 % triamcinolone acetonide 0.5 % topical ointment    . influenza vac split quadrivalent PF (FLUARIX)  0.5 ML injection Afluria Quad 2018-2019 (PF) 60 mcg (15 mcg x 4)/0.5 mL IM syringe  ADM 0.5ML IM UTD    . Omeprazole (PRILOSEC PO) omeprazole     No facility-administered medications prior to visit.    Review of Systems  Constitutional: Negative for chills, fever, malaise/fatigue and weight loss.  HENT: Negative for hearing loss, sore throat and tinnitus.   Eyes: Negative for blurred vision and double vision.  Respiratory: Negative for cough, hemoptysis, sputum production, shortness of breath, wheezing and stridor.   Cardiovascular: Negative for chest pain, palpitations, orthopnea, leg swelling and PND.  Gastrointestinal: Negative for abdominal pain, constipation, diarrhea, heartburn, nausea and vomiting.  Genitourinary: Negative for dysuria, hematuria and urgency.  Musculoskeletal: Negative for joint pain and myalgias.  Skin: Negative for itching and rash.  Neurological: Negative for dizziness, tingling, weakness and headaches.  Endo/Heme/Allergies: Negative for environmental allergies. Does not bruise/bleed easily.  Psychiatric/Behavioral: Negative for depression. The patient is not nervous/anxious and does not have insomnia.   All other systems reviewed and are negative.    Objective:  Physical Exam Vitals reviewed.  Constitutional:      General: She is not in acute distress.    Appearance: She is well-developed.  HENT:     Head: Normocephalic and atraumatic.  Eyes:     General: No scleral icterus.    Conjunctiva/sclera: Conjunctivae normal.     Pupils: Pupils are equal, round, and reactive to light.  Neck:     Vascular: No JVD.     Trachea: No tracheal deviation.  Cardiovascular:     Rate and Rhythm: Normal rate and regular rhythm.     Heart sounds: Normal heart sounds. No murmur.  Pulmonary:     Effort: Pulmonary effort is normal. No tachypnea, accessory muscle usage or respiratory distress.     Breath sounds: Normal breath sounds. No stridor. No wheezing, rhonchi or  rales.  Abdominal:     General: Bowel sounds are normal. There is no distension.     Palpations: Abdomen is soft.     Tenderness: There is no abdominal tenderness.  Musculoskeletal:        General: No tenderness.     Cervical back: Neck supple.  Lymphadenopathy:     Cervical: No cervical adenopathy.  Skin:    General: Skin is warm and dry.     Capillary Refill: Capillary refill takes less than 2 seconds.     Findings: No rash.  Neurological:     Mental Status: She is alert and oriented  to person, place, and time.  Psychiatric:        Behavior: Behavior normal.      Vitals:   04/17/20 1023  BP: (!) 142/82  Pulse: 69  Temp: (!) 96.2 F (35.7 C)  TempSrc: Temporal  SpO2: 100%  Weight: 213 lb 12.8 oz (97 kg)  Height: 5' 5.5" (1.664 m)   100% on RA BMI Readings from Last 3 Encounters:  04/17/20 35.04 kg/m  03/28/20 35.28 kg/m   Wt Readings from Last 3 Encounters:  04/17/20 213 lb 12.8 oz (97 kg)  03/28/20 215 lb 4.8 oz (97.7 kg)     CBC No results found for: WBC, RBC, HGB, HCT, PLT, MCV, MCH, MCHC, RDW, LYMPHSABS, MONOABS, EOSABS, BASOSABS  Chest Imaging: Medicine pet imaging April 2021: Low-level PET uptake between patient's right lower lobe pulmonary nodule SUV 3.0 possible underlying malignancy. The patient's images have been independently reviewed by me.    Pulmonary Functions Testing Results: No flowsheet data found.  FeNO: none  Pathology: none  Echocardiogram: none  Heart Catheterization: none    Assessment & Plan:     ICD-10-CM   1. Lung nodule  R91.1 CT Super D Chest Wo Contrast    Ambulatory referral to Pulmonology  2. Cancer of sigmoid colon ([place])  C18.7   3. Abnormal PET of right lung  R94.2     Discussion: This is a 49 year old female recent diagnosis of adenocarcinoma sigmoid colon.  Chest imaging with right lower lobe nodule.  CT scan and PET scan reviewed.  Low-level PET uptake with SUV of 3.  This is concerning for potential  underlying malignancy of the right lower lobe of the lung.  Assessment:   New diagnosis right lower lobe pulmonary nodule concerning for underlying malignancy abnormal PET scan of the lung. Need to rule out metastasis of colon which would be unlikely.  This is potentially a low-grade neuroendocrine tumor based on demographic, non-smoker as well as low level PET activity.  Plan Following Extensive Data Review & Interpretation:  . I reviewed prior external note(s) from Dr. Burr Medico med onc 03/28/2020 . I reviewed the result(s) of colon pathology, adenocarcinoma  . I have ordered non contrasted SuperD CT chest for bronch planning   Independent interpretation of tests . Review of patient's 03/27/2020 nuclear medicine PET images revealed right lower lobe low-grade metabolic activity SUV 3.. The patient's images have been independently reviewed by me.    Discussion of management or test interpretation with Dr. Burr Medico, Medical oncology   We will schedule for video bronchoscopy with navigation.  Plans for this on 04/23/2020. Today in the office we discussed risk benefits alternatives of proceeding with video bronchoscopy and biopsy. We discussed transthoracic needle biopsy and risk of pneumothorax related to this versus bronchoscopy risk of pneumothorax.  Additionally we discussed risk of bleeding.  Patient is agreeable to proceed with bronchoscopy as scheduled.  She will also need a super D noncontrasted CT.  I have ordered this stat.  To be completed prior to patient's bronchoscopy.    Current Outpatient Medications:  .  B Complex Vitamins (VITAMIN B COMPLEX 100 IJ), vitamin B complex, Disp: , Rfl:  .  Calcium Carb-Cholecalciferol (CALCIUM 1000 + D PO), Calcium With Vitamin D3, Disp: , Rfl:  .  ibuprofen (ADVIL) 600 MG tablet, ibuprofen 600 mg tablet, Disp: , Rfl:  .  influenza vaccine (FLUCELVAX QUADRIVALENT) 0.5 ML injection, Flucelvax Quad 2020-2021 (PF) 60 mcg (15 mcg x 4)/0.5 mL IM syringe  ADM  0.5ML IM UTD, Disp: , Rfl:  .  Loratadine (CLARITIN) 10 MG CAPS, Claritin, Disp: , Rfl:  .  Multiple Vitamins-Minerals (HAIR SKIN AND NAILS FORMULA PO), Hair,Skin and Nails, Disp: , Rfl:  .  Multiple Vitamins-Minerals (MULTIVITAMIN ADULT EXTRA C PO), multivitamin, Disp: , Rfl:  .  Omega-3 Fatty Acids (FISH OIL) 1000 MG CAPS, Fish Oil, Disp: , Rfl:  .  Probiotic Product (PROBIOTIC-10 PO), Probiotic, Disp: , Rfl:  .  triamcinolone ointment (KENALOG) 0.5 %, triamcinolone acetonide 0.5 % topical ointment, Disp: , Rfl:  .  Omeprazole (PRILOSEC PO), omeprazole, Disp: , Rfl:    Garner Nash, DO [facility] 04/17/2020 10:54 AM

## 2020-04-17 NOTE — Patient Instructions (Signed)
Thank you for visiting Dr. Valeta Harms at [facility]. Today we recommend the following:  Orders Placed This Encounter  Procedures  . CT Super D Chest Wo Contrast  . Ambulatory referral to Pulmonology   Nothing by mouth the night before past midnight   Return in about 2 weeks (around 05/01/2020) for with APP,  w/ Tammy Parrett.    Please do your part to reduce the spread of COVID-19.

## 2020-04-17 NOTE — Telephone Encounter (Signed)
PCCM: Thanks Garner Nash, DO [facility] 04/17/2020 5:16 PM

## 2020-04-17 NOTE — Telephone Encounter (Signed)
And sending it over tomorrow

## 2020-04-17 NOTE — H&P (View-Only) (Signed)
Synopsis: Referred in May 2021 for pulmonary nodule by Garner Nash, DO  Subjective:   PATIENT ID: Amanda Bernard GENDER: female DOB: [birth year], MRN: [id number]  Chief Complaint  Patient presents with  . Consult    This is a 49 year old female, past medical history of sigmoid colon cancer and newly diagnosed right lung nodule.  Patient underwent biopsies and February 2021 which revealed an invasive adenocarcinoma.  Patient's images were reviewed at medical thoracic oncology conference and decision was made for biopsy of the lower lobe lung nodule.  It did have low-level SUV uptake on PET imaging.  Patient was referred to pulmonary for navigational bronchoscopy evaluation.  OV 04/17/2020: Patient with no respiratory complaints today.  Little anxious about everything that has been going on recently.  Is aware that she was referred here for bronchoscopy and biopsy.  We discussed this today in the office with detail.  Patient is a non-smoker.   Past Medical History:  Diagnosis Date  . Colon cancer [facility])      Family History  Problem Relation Age of Onset  . Cancer Paternal Uncle        brain cancer   . Cancer Maternal Grandmother 40       ovarian cancer   . Cancer Paternal Grandfather        lung cancer  . Cancer Child 1       testicular cancer      History reviewed. No pertinent surgical history.  Social History   Socioeconomic History  . Marital status: Widowed    Spouse name: Not on file  . Number of children: 3  . Years of education: Not on file  . Highest education level: Not on file  Occupational History  . Not on file  Tobacco Use  . Smoking status: Former Smoker    Packs/day: 1.00    Years: 25.00    Pack years: 25.00    Quit date: 12/07/2009    Years since quitting: 10.3  . Smokeless tobacco: Current User  Substance and Sexual Activity  . Alcohol use: Yes    Alcohol/week: 20.0 standard drinks    Types: 20 Cans of beer per week    Comment: for 3 years    . Drug use: Not on file  . Sexual activity: Yes  Other Topics Concern  . Not on file  Social History Narrative  . Not on file   Social Determinants of Health   Financial Resource Strain:   . Difficulty of Paying Living Expenses:   Food Insecurity:   . Worried About Charity fundraiser in the Last Year:   . Arboriculturist in the Last Year:   Transportation Needs:   . Film/video editor (Medical):   Marland Kitchen Lack of Transportation (Non-Medical):   Physical Activity:   . Days of Exercise per Week:   . Minutes of Exercise per Session:   Stress:   . Feeling of Stress :   Social Connections:   . Frequency of Communication with Friends and Family:   . Frequency of Social Gatherings with Friends and Family:   . Attends Religious Services:   . Active Member of Clubs or Organizations:   . Attends Archivist Meetings:   Marland Kitchen Marital Status:   Intimate Partner Violence:   . Fear of Current or Ex-Partner:   . Emotionally Abused:   Marland Kitchen Physically Abused:   . Sexually Abused:      No Known Allergies  Outpatient Medications Prior to Visit  Medication Sig Dispense Refill  . B Complex Vitamins (VITAMIN B COMPLEX 100 IJ) vitamin B complex    . Calcium Carb-Cholecalciferol (CALCIUM 1000 + D PO) Calcium With Vitamin D3    . ibuprofen (ADVIL) 600 MG tablet ibuprofen 600 mg tablet    . influenza vaccine (FLUCELVAX QUADRIVALENT) 0.5 ML injection Flucelvax Quad 2020-2021 (PF) 60 mcg (15 mcg x 4)/0.5 mL IM syringe  ADM 0.5ML IM UTD    . Loratadine (CLARITIN) 10 MG CAPS Claritin    . Multiple Vitamins-Minerals (HAIR SKIN AND NAILS FORMULA PO) Hair,Skin and Nails    . Multiple Vitamins-Minerals (MULTIVITAMIN ADULT EXTRA C PO) multivitamin    . Omega-3 Fatty Acids (FISH OIL) 1000 MG CAPS Fish Oil    . Probiotic Product (PROBIOTIC-10 PO) Probiotic    . triamcinolone ointment (KENALOG) 0.5 % triamcinolone acetonide 0.5 % topical ointment    . influenza vac split quadrivalent PF (FLUARIX)  0.5 ML injection Afluria Quad 2018-2019 (PF) 60 mcg (15 mcg x 4)/0.5 mL IM syringe  ADM 0.5ML IM UTD    . Omeprazole (PRILOSEC PO) omeprazole     No facility-administered medications prior to visit.    Review of Systems  Constitutional: Negative for chills, fever, malaise/fatigue and weight loss.  HENT: Negative for hearing loss, sore throat and tinnitus.   Eyes: Negative for blurred vision and double vision.  Respiratory: Negative for cough, hemoptysis, sputum production, shortness of breath, wheezing and stridor.   Cardiovascular: Negative for chest pain, palpitations, orthopnea, leg swelling and PND.  Gastrointestinal: Negative for abdominal pain, constipation, diarrhea, heartburn, nausea and vomiting.  Genitourinary: Negative for dysuria, hematuria and urgency.  Musculoskeletal: Negative for joint pain and myalgias.  Skin: Negative for itching and rash.  Neurological: Negative for dizziness, tingling, weakness and headaches.  Endo/Heme/Allergies: Negative for environmental allergies. Does not bruise/bleed easily.  Psychiatric/Behavioral: Negative for depression. The patient is not nervous/anxious and does not have insomnia.   All other systems reviewed and are negative.    Objective:  Physical Exam Vitals reviewed.  Constitutional:      General: She is not in acute distress.    Appearance: She is well-developed.  HENT:     Head: Normocephalic and atraumatic.  Eyes:     General: No scleral icterus.    Conjunctiva/sclera: Conjunctivae normal.     Pupils: Pupils are equal, round, and reactive to light.  Neck:     Vascular: No JVD.     Trachea: No tracheal deviation.  Cardiovascular:     Rate and Rhythm: Normal rate and regular rhythm.     Heart sounds: Normal heart sounds. No murmur.  Pulmonary:     Effort: Pulmonary effort is normal. No tachypnea, accessory muscle usage or respiratory distress.     Breath sounds: Normal breath sounds. No stridor. No wheezing, rhonchi or  rales.  Abdominal:     General: Bowel sounds are normal. There is no distension.     Palpations: Abdomen is soft.     Tenderness: There is no abdominal tenderness.  Musculoskeletal:        General: No tenderness.     Cervical back: Neck supple.  Lymphadenopathy:     Cervical: No cervical adenopathy.  Skin:    General: Skin is warm and dry.     Capillary Refill: Capillary refill takes less than 2 seconds.     Findings: No rash.  Neurological:     Mental Status: She is alert and oriented  to person, place, and time.  Psychiatric:        Behavior: Behavior normal.      Vitals:   04/17/20 1023  BP: (!) 142/82  Pulse: 69  Temp: (!) 96.2 F (35.7 C)  TempSrc: Temporal  SpO2: 100%  Weight: 213 lb 12.8 oz (97 kg)  Height: 5' 5.5" (1.664 m)   100% on RA BMI Readings from Last 3 Encounters:  04/17/20 35.04 kg/m  03/28/20 35.28 kg/m   Wt Readings from Last 3 Encounters:  04/17/20 213 lb 12.8 oz (97 kg)  03/28/20 215 lb 4.8 oz (97.7 kg)     CBC No results found for: WBC, RBC, HGB, HCT, PLT, MCV, MCH, MCHC, RDW, LYMPHSABS, MONOABS, EOSABS, BASOSABS  Chest Imaging: Medicine pet imaging April 2021: Low-level PET uptake between patient's right lower lobe pulmonary nodule SUV 3.0 possible underlying malignancy. The patient's images have been independently reviewed by me.    Pulmonary Functions Testing Results: No flowsheet data found.  FeNO: none  Pathology: none  Echocardiogram: none  Heart Catheterization: none    Assessment & Plan:     ICD-10-CM   1. Lung nodule  R91.1 CT Super D Chest Wo Contrast    Ambulatory referral to Pulmonology  2. Cancer of sigmoid colon ([place])  C18.7   3. Abnormal PET of right lung  R94.2     Discussion: This is a 49 year old female recent diagnosis of adenocarcinoma sigmoid colon.  Chest imaging with right lower lobe nodule.  CT scan and PET scan reviewed.  Low-level PET uptake with SUV of 3.  This is concerning for potential  underlying malignancy of the right lower lobe of the lung.  Assessment:   New diagnosis right lower lobe pulmonary nodule concerning for underlying malignancy abnormal PET scan of the lung. Need to rule out metastasis of colon which would be unlikely.  This is potentially a low-grade neuroendocrine tumor based on demographic, non-smoker as well as low level PET activity.  Plan Following Extensive Data Review & Interpretation:  . I reviewed prior external note(s) from Dr. Burr Medico med onc 03/28/2020 . I reviewed the result(s) of colon pathology, adenocarcinoma  . I have ordered non contrasted SuperD CT chest for bronch planning   Independent interpretation of tests . Review of patient's 03/27/2020 nuclear medicine PET images revealed right lower lobe low-grade metabolic activity SUV 3.. The patient's images have been independently reviewed by me.    Discussion of management or test interpretation with Dr. Burr Medico, Medical oncology   We will schedule for video bronchoscopy with navigation.  Plans for this on 04/23/2020. Today in the office we discussed risk benefits alternatives of proceeding with video bronchoscopy and biopsy. We discussed transthoracic needle biopsy and risk of pneumothorax related to this versus bronchoscopy risk of pneumothorax.  Additionally we discussed risk of bleeding.  Patient is agreeable to proceed with bronchoscopy as scheduled.  She will also need a super D noncontrasted CT.  I have ordered this stat.  To be completed prior to patient's bronchoscopy.    Current Outpatient Medications:  .  B Complex Vitamins (VITAMIN B COMPLEX 100 IJ), vitamin B complex, Disp: , Rfl:  .  Calcium Carb-Cholecalciferol (CALCIUM 1000 + D PO), Calcium With Vitamin D3, Disp: , Rfl:  .  ibuprofen (ADVIL) 600 MG tablet, ibuprofen 600 mg tablet, Disp: , Rfl:  .  influenza vaccine (FLUCELVAX QUADRIVALENT) 0.5 ML injection, Flucelvax Quad 2020-2021 (PF) 60 mcg (15 mcg x 4)/0.5 mL IM syringe  ADM  0.5ML IM UTD, Disp: , Rfl:  .  Loratadine (CLARITIN) 10 MG CAPS, Claritin, Disp: , Rfl:  .  Multiple Vitamins-Minerals (HAIR SKIN AND NAILS FORMULA PO), Hair,Skin and Nails, Disp: , Rfl:  .  Multiple Vitamins-Minerals (MULTIVITAMIN ADULT EXTRA C PO), multivitamin, Disp: , Rfl:  .  Omega-3 Fatty Acids (FISH OIL) 1000 MG CAPS, Fish Oil, Disp: , Rfl:  .  Probiotic Product (PROBIOTIC-10 PO), Probiotic, Disp: , Rfl:  .  triamcinolone ointment (KENALOG) 0.5 %, triamcinolone acetonide 0.5 % topical ointment, Disp: , Rfl:  .  Omeprazole (PRILOSEC PO), omeprazole, Disp: , Rfl:    Garner Nash, DO [facility] 04/17/2020 10:54 AM

## 2020-04-18 ENCOUNTER — Telehealth: Payer: Self-pay | Admitting: Pulmonary Disease

## 2020-04-18 NOTE — Telephone Encounter (Signed)
Error

## 2020-04-19 ENCOUNTER — Telehealth: Payer: Self-pay | Admitting: Internal Medicine

## 2020-04-19 ENCOUNTER — Other Ambulatory Visit (INDEPENDENT_AMBULATORY_CARE_PROVIDER_SITE_OTHER): Payer: BC Managed Care – PPO

## 2020-04-19 ENCOUNTER — Other Ambulatory Visit (HOSPITAL_COMMUNITY)
Admission: RE | Admit: 2020-04-19 | Discharge: 2020-04-19 | Disposition: A | Discharge status: Home / Self Care | Payer: BC Managed Care – PPO | Source: Ambulatory Visit | Attending: Pulmonary Disease | Admitting: Pulmonary Disease

## 2020-04-19 DIAGNOSIS — Z20822 Contact with and (suspected) exposure to covid-19: Secondary | ICD-10-CM | POA: Diagnosis not present

## 2020-04-19 DIAGNOSIS — Z01812 Encounter for preprocedural laboratory examination: Secondary | ICD-10-CM | POA: Insufficient documentation

## 2020-04-19 DIAGNOSIS — R911 Solitary pulmonary nodule: Secondary | ICD-10-CM

## 2020-04-19 DIAGNOSIS — R942 Abnormal results of pulmonary function studies: Secondary | ICD-10-CM

## 2020-04-19 LAB — COMPREHENSIVE METABOLIC PANEL
ALT: 16 U/L (ref 0–35)
AST: 16 U/L (ref 0–37)
Albumin: 4 g/dL (ref 3.5–5.2)
Alkaline Phosphatase: 88 U/L (ref 39–117)
BUN: 9 mg/dL (ref 6–23)
CO2: 28 mEq/L (ref 19–32)
Calcium: 8.9 mg/dL (ref 8.4–10.5)
Chloride: 104 mEq/L (ref 96–112)
Creatinine, Ser: 0.64 mg/dL (ref 0.40–1.20)
GFR: 97.93 mL/min (ref >60.00)
Glucose, Bld: 110 mg/dL — ABNORMAL HIGH (ref 70–99)
Potassium: 3.7 mEq/L (ref 3.5–5.1)
Sodium: 139 mEq/L (ref 135–145)
Total Bilirubin: 0.4 mg/dL (ref 0.2–1.2)
Total Protein: 7 g/dL (ref 6.0–8.3)

## 2020-04-19 LAB — CBC WITH DIFFERENTIAL/PLATELET
Basophils Absolute: 0.1 10*3/uL (ref 0.0–0.1)
Basophils Relative: 0.9 % (ref 0.0–3.0)
Eosinophils Absolute: 0.3 10*3/uL (ref 0.0–0.7)
Eosinophils Relative: 4.2 % (ref 0.0–5.0)
HCT: 43.7 % (ref 36.0–46.0)
Hemoglobin: 14.5 g/dL (ref 12.0–15.0)
Lymphocytes Relative: 36.1 % (ref 12.0–46.0)
Lymphs Abs: 2.2 10*3/uL (ref 0.7–4.0)
MCHC: 33.2 g/dL (ref 30.0–36.0)
MCV: 91.9 fl (ref 78.0–100.0)
Monocytes Absolute: 0.4 10*3/uL (ref 0.1–1.0)
Monocytes Relative: 6.8 % (ref 3.0–12.0)
Neutro Abs: 3.2 10*3/uL (ref 1.4–7.7)
Neutrophils Relative %: 52 % (ref 43.0–77.0)
Platelets: 276 10*3/uL (ref 150.0–400.0)
RBC: 4.76 Mil/uL (ref 3.87–5.11)
RDW: 14.2 % (ref 11.5–15.5)
WBC: 6.2 10*3/uL (ref 4.0–10.5)

## 2020-04-19 LAB — APTT: aPTT: 28.9 s (ref 23.4–32.7)

## 2020-04-19 LAB — PROTIME-INR
INR: 1 ratio (ref 0.8–1.0)
Prothrombin Time: 10.8 s (ref 9.6–13.1)

## 2020-04-19 LAB — SARS CORONAVIRUS 2 (TAT 6-24 HRS): SARS Coronavirus 2: NEGATIVE

## 2020-04-19 NOTE — Telephone Encounter (Signed)
CBC, CMP, PT, PTT INR  BLI

## 2020-04-19 NOTE — Telephone Encounter (Signed)
Checked pt's chart and the only lab I saw that was ordered was the covid test prior to pt's bronch.  Called and spoke with [facility] about this and she verbalized understanding. I told her that I would also reach out to pt.  Called and spoke with pt letting her know that that was the only thing I was seeing that was ordered for labs. Pt stated when she was at the office, whomever let her go after the visit had stated that she was needing to go over to [place] lab for some bloodwork. No other labs were ordered.  Dr. Valeta Harms, please advise on this if pt was needing to get some labs performed and if so, what labs?

## 2020-04-19 NOTE — Telephone Encounter (Signed)
Labs have been placed. Called and spoke with pt letting her know this and she verbalized understanding. Nothing further needed.

## 2020-04-20 ENCOUNTER — Other Ambulatory Visit (HOSPITAL_COMMUNITY): Payer: BC Managed Care – PPO

## 2020-04-22 ENCOUNTER — Other Ambulatory Visit: Payer: Self-pay

## 2020-04-22 ENCOUNTER — Encounter (HOSPITAL_COMMUNITY): Payer: Self-pay | Admitting: Pulmonary Disease

## 2020-04-22 NOTE — Progress Notes (Signed)
Pt denies SOB, chest pain, and being under the care of a cardiologist and PCP. Pt denies having a stress test, echo and cardiac cath. Pt denies having an EKG and chest x ray. Pt made aware to stop takingAspirin (unless otherwise advised by surgeon), vitamins, fish oil and herbal medications. Do not take any NSAIDs ie: Ibuprofen, Advil, Naproxen (Aleve), Motrin, BC and Goody Powder. Pt reminded to quarantine. Pt verbalized understanding of all pre-op instructions.

## 2020-04-23 ENCOUNTER — Encounter (HOSPITAL_COMMUNITY)
Admission: RE | Disposition: A | Discharge status: Home / Self Care | Payer: Self-pay | Source: Home / Self Care | Attending: Pulmonary Disease

## 2020-04-23 ENCOUNTER — Ambulatory Visit (HOSPITAL_COMMUNITY): Payer: BC Managed Care – PPO | Admitting: Anesthesiology

## 2020-04-23 ENCOUNTER — Ambulatory Visit (HOSPITAL_COMMUNITY): Payer: BC Managed Care – PPO

## 2020-04-23 ENCOUNTER — Other Ambulatory Visit: Payer: Self-pay

## 2020-04-23 ENCOUNTER — Ambulatory Visit (HOSPITAL_COMMUNITY)
Admission: RE | Admit: 2020-04-23 | Discharge: 2020-04-23 | Disposition: A | Discharge status: Home / Self Care | Payer: BC Managed Care – PPO | Attending: Pulmonary Disease | Admitting: Pulmonary Disease

## 2020-04-23 ENCOUNTER — Encounter (HOSPITAL_COMMUNITY): Payer: Self-pay | Admitting: Pulmonary Disease

## 2020-04-23 DIAGNOSIS — R911 Solitary pulmonary nodule: Secondary | ICD-10-CM

## 2020-04-23 DIAGNOSIS — Z79899 Other long term (current) drug therapy: Secondary | ICD-10-CM | POA: Insufficient documentation

## 2020-04-23 DIAGNOSIS — Z85038 Personal history of other malignant neoplasm of large intestine: Secondary | ICD-10-CM | POA: Diagnosis not present

## 2020-04-23 DIAGNOSIS — Z87891 Personal history of nicotine dependence: Secondary | ICD-10-CM | POA: Diagnosis not present

## 2020-04-23 DIAGNOSIS — E669 Obesity, unspecified: Secondary | ICD-10-CM | POA: Diagnosis not present

## 2020-04-23 DIAGNOSIS — C187 Malignant neoplasm of sigmoid colon: Secondary | ICD-10-CM | POA: Diagnosis not present

## 2020-04-23 DIAGNOSIS — K219 Gastro-esophageal reflux disease without esophagitis: Secondary | ICD-10-CM | POA: Insufficient documentation

## 2020-04-23 DIAGNOSIS — F419 Anxiety disorder, unspecified: Secondary | ICD-10-CM | POA: Diagnosis not present

## 2020-04-23 DIAGNOSIS — Z6834 Body mass index (BMI) 34.0-34.9, adult: Secondary | ICD-10-CM | POA: Diagnosis not present

## 2020-04-23 DIAGNOSIS — Z9889 Other specified postprocedural states: Secondary | ICD-10-CM | POA: Diagnosis not present

## 2020-04-23 HISTORY — PX: BRONCHIAL BIOPSY: SHX5109

## 2020-04-23 HISTORY — PX: VIDEO BRONCHOSCOPY WITH ENDOBRONCHIAL NAVIGATION: SHX6175

## 2020-04-23 HISTORY — DX: Gastro-esophageal reflux disease without esophagitis: K21.9

## 2020-04-23 HISTORY — PX: BRONCHIAL WASHINGS: SHX5105

## 2020-04-23 HISTORY — PX: BRONCHIAL BRUSHINGS: SHX5108

## 2020-04-23 HISTORY — DX: Anxiety disorder, unspecified: F41.9

## 2020-04-23 HISTORY — PX: FIDUCIAL MARKER PLACEMENT: SHX6858

## 2020-04-23 HISTORY — DX: Solitary pulmonary nodule: R91.1

## 2020-04-23 HISTORY — DX: Obesity, unspecified: E66.9

## 2020-04-23 HISTORY — PX: BRONCHIAL NEEDLE ASPIRATION BIOPSY: SHX5106

## 2020-04-23 LAB — POCT PREGNANCY, URINE: Preg Test, Ur: NEGATIVE

## 2020-04-23 SURGERY — VIDEO BRONCHOSCOPY WITH ENDOBRONCHIAL NAVIGATION
Anesthesia: General

## 2020-04-23 MED ORDER — PHENYLEPHRINE HCL (PRESSORS) 10 MG/ML IV SOLN
INTRAVENOUS | Status: DC | PRN
  Administered 2020-04-23 (×3): 120 ug via INTRAVENOUS
  Administered 2020-04-23: 200 ug via INTRAVENOUS

## 2020-04-23 MED ORDER — LACTATED RINGERS IV SOLN: INTRAVENOUS | Status: DC

## 2020-04-23 MED ORDER — ACETAMINOPHEN 500 MG PO TABS
1000.0000 mg | ORAL_TABLET | Freq: Once | ORAL | Status: AC
  Administered 2020-04-23: 1000 mg via ORAL
  Filled 2020-04-23: qty 2

## 2020-04-23 MED ORDER — OXYCODONE HCL 5 MG PO TABS: 5.0000 mg | ORAL_TABLET | Freq: Once | ORAL | Status: DC | PRN

## 2020-04-23 MED ORDER — PROPOFOL 10 MG/ML IV BOLUS
INTRAVENOUS | Status: DC | PRN
  Administered 2020-04-23: 150 mg via INTRAVENOUS

## 2020-04-23 MED ORDER — MIDAZOLAM HCL 5 MG/5ML IJ SOLN
INTRAMUSCULAR | Status: DC | PRN
  Administered 2020-04-23: 2 mg via INTRAVENOUS

## 2020-04-23 MED ORDER — HYDROMORPHONE HCL 1 MG/ML IJ SOLN: 0.2500 mg | INTRAMUSCULAR | Status: DC | PRN

## 2020-04-23 MED ORDER — SUGAMMADEX SODIUM 200 MG/2ML IV SOLN
INTRAVENOUS | Status: DC | PRN
  Administered 2020-04-23: 200 mg via INTRAVENOUS

## 2020-04-23 MED ORDER — MIDAZOLAM HCL 2 MG/2ML IJ SOLN
INTRAMUSCULAR | Status: AC
  Filled 2020-04-23: qty 2

## 2020-04-23 MED ORDER — ROCURONIUM BROMIDE 10 MG/ML (PF) SYRINGE
PREFILLED_SYRINGE | INTRAVENOUS | Status: DC | PRN
  Administered 2020-04-23: 50 mg via INTRAVENOUS
  Administered 2020-04-23: 10 mg via INTRAVENOUS

## 2020-04-23 MED ORDER — LIDOCAINE 2% (20 MG/ML) 5 ML SYRINGE
INTRAMUSCULAR | Status: DC | PRN
  Administered 2020-04-23: 50 mg via INTRAVENOUS

## 2020-04-23 MED ORDER — FENTANYL CITRATE (PF) 100 MCG/2ML IJ SOLN
INTRAMUSCULAR | Status: DC | PRN
  Administered 2020-04-23 (×2): 50 ug via INTRAVENOUS

## 2020-04-23 MED ORDER — DEXAMETHASONE SODIUM PHOSPHATE 10 MG/ML IJ SOLN
INTRAMUSCULAR | Status: DC | PRN
  Administered 2020-04-23: 4 mg via INTRAVENOUS

## 2020-04-23 MED ORDER — OXYCODONE HCL 5 MG/5ML PO SOLN: 5.0000 mg | Freq: Once | ORAL | Status: DC | PRN

## 2020-04-23 MED ORDER — ONDANSETRON HCL 4 MG/2ML IJ SOLN
INTRAMUSCULAR | Status: DC | PRN
  Administered 2020-04-23: 4 mg via INTRAVENOUS

## 2020-04-23 MED ORDER — PROMETHAZINE HCL 25 MG/ML IJ SOLN: 6.2500 mg | INTRAMUSCULAR | Status: DC | PRN

## 2020-04-23 MED ORDER — FENTANYL CITRATE (PF) 100 MCG/2ML IJ SOLN
INTRAMUSCULAR | Status: AC
  Filled 2020-04-23: qty 2

## 2020-04-23 MED ORDER — EPHEDRINE SULFATE 50 MG/ML IJ SOLN
INTRAMUSCULAR | Status: DC | PRN
  Administered 2020-04-23 (×2): 5 mg via INTRAVENOUS

## 2020-04-23 SURGICAL SUPPLY — 47 items

## 2020-04-23 NOTE — Op Note (Signed)
Video Bronchoscopy with Electromagnetic Navigation with fiducial placement procedure Note  Date of Operation: 04/23/2020  Pre-op Diagnosis: Lung nodule right lower lobe  Post-op Diagnosis: Lung nodule right lower lobe  Surgeon: Garner Nash   Assistants: none   Anesthesia: General endotracheal anesthesia  Operation: Flexible video fiberoptic bronchoscopy with electromagnetic navigation and biopsies.  Estimated Blood Loss: Minimal, <5IE  Complications: None   Indications and History: Amanda Bernard is a 49 y.o. female with right lower lobe pulmonary nodule.  The risks, benefits, complications, treatment options and expected outcomes were discussed with the patient.  The possibilities of pneumothorax, pneumonia, reaction to medication, pulmonary aspiration, perforation of a viscus, bleeding, failure to diagnose a condition and creating a complication requiring transfusion or operation were discussed with the patient who freely signed the consent.    Description of Procedure: The patient was seen in the Preoperative Area, was examined and was deemed appropriate to proceed.  The patient was taken to [facility] endoscopy room 2, identified as Amanda Bernard and the procedure verified as Flexible Video Fiberoptic Bronchoscopy.  A Time Out was held and the above information confirmed.   Prior to the date of the procedure a high-resolution CT scan of the chest was performed. Utilizing [place] a virtual tracheobronchial tree was generated to allow the creation of distinct navigation pathways to the patient's parenchymal abnormalities. After being taken to the operating room general anesthesia was initiated and the patient  was orally intubated. The video fiberoptic bronchoscope was introduced via the endotracheal tube and a general inspection was performed which showed normal right and left lung anatomy no evidence of endobronchial lesion. The extendable working channel and  locator guide were introduced into the bronchoscope. The distinct navigation pathways prepared prior to this procedure were then utilized to navigate to within 0.8 cm of patient's lesion(s) identified on CT scan.  There was a full fluoroscopic sweep for local registration completed under continuous fluoroscopy from RAO 25 to LAO 25 with an APL of 15 inspiratory breath-hold. The extendable working channel was secured into place and the locator guide was withdrawn. Under fluoroscopic guidance transbronchial needle brushings, transbronchial Wang needle biopsies, and transbronchial forceps biopsies were performed to be sent for cytology and pathology. A bronchioalveolar lavage was performed in the right lower lobe and sent for cytology.  At the end of the procedure we advanced the catheter just above the lesion identified on fluoroscopy and CT imaging.  We placed 1 gold fiducial proximal to this lesion under real-time fluoroscopy.  And fiducial catheter system.  At the end of the procedure a general airway inspection was performed and there was no evidence of active bleeding.  Therapeutic bronchoscope was used for clearance and bilateral aspiration of mainstem's and distal lower lobes to ensure removal of all blood clots and secretions prior to leaving the patient's airway.bronchoscope was brought to just above the main carina there was no evidence of active bleeding.  The bronchoscope was removed.  The patient tolerated the procedure well. There was no significant blood loss and there were no obvious complications. A post-procedural chest x-ray is pending.  Samples: 1. Transbronchial needle brushings from right lower lobe 2. Transbronchial Wang needle biopsies from right lower lobe 3. Transbronchial forceps biopsies from right lower lobe 4. Bronchoalveolar lavage from right lower lobe  Plans:  The patient will be discharged from the PACU to home when recovered from anesthesia and after chest x-ray is reviewed.  We will review the cytology, pathology  and microbiology results with the patient when they become available. Outpatient followup will be with Dr. Burr Medico from oncology.   [facility] 04/23/2020 3:45 PM

## 2020-04-23 NOTE — Anesthesia Postprocedure Evaluation (Signed)
Anesthesia Post Note  Patient: Amanda Bernard  Procedure(s) Performed: VIDEO BRONCHOSCOPY WITH ENDOBRONCHIAL NAVIGATION (N/A ) BRONCHIAL BRUSHINGS BRONCHIAL NEEDLE ASPIRATION BIOPSIES BRONCHIAL BIOPSIES FIDUCIAL MARKER PLACEMENT BRONCHIAL WASHINGS     Patient location during evaluation: PACU Anesthesia Type: General Level of consciousness: awake and alert Pain management: pain level controlled Vital Signs Assessment: post-procedure vital signs reviewed and stable Respiratory status: spontaneous breathing, nonlabored ventilation, respiratory function stable and patient connected to nasal cannula oxygen Cardiovascular status: blood pressure returned to baseline and stable Postop Assessment: no apparent nausea or vomiting Anesthetic complications: no    Last Vitals:  Vitals:   04/23/20 1503 04/23/20 1513  BP: 137/69 136/67  Pulse: 80 77  Resp: 12 11  Temp:    SpO2: 95% 97%    Last Pain:  Vitals:   04/23/20 1513  TempSrc:   PainSc: 0-No pain                 Diksha Tagliaferro P Aasir Daigler

## 2020-04-23 NOTE — Discharge Instructions (Signed)
Bronchoscopy, Care After This sheet gives you information about how to care for yourself after your test. Your doctor may also give you more specific instructions. If you have problems or questions, contact your doctor. Follow these instructions at home: Eating and drinking  The day after the test, go back to your normal diet. Driving  Do not drive for 24 hours if you were given a medicine to help you relax (sedative).  Do not drive or use heavy machinery while taking prescription pain medicine. General instructions   Take over-the-counter and prescription medicines only as told by your doctor.  Return to your normal activities as told. Ask what activities are safe for you.  Do not use any products that have nicotine or tobacco in them. This includes cigarettes and e-cigarettes. If you need help quitting, ask your doctor.  Keep all follow-up visits as told by your doctor. This is important. It is very important if you had a tissue sample (biopsy) taken. Get help right away if:  You have shortness of breath that gets worse.  You get light-headed.  You feel like you are going to pass out (faint).  You have chest pain.  You cough up: ? More than a little blood. ? More blood than before. Summary  Do not eat or drink anything (not even water) for 2 hours after your test, or until your numbing medicine wears off.  Do not use cigarettes. Do not use e-cigarettes.  Get help right away if you have chest pain. This information is not intended to replace advice given to you by your health care provider. Make sure you discuss any questions you have with your health care provider. Document Revised: 11/05/2017 Document Reviewed: 12/11/2016 Elsevier Patient Education  2020 [employer].

## 2020-04-23 NOTE — Interval H&P Note (Signed)
History and Physical Interval Note:  04/23/2020 12:16 PM  Amanda Bernard  has presented today for surgery, with the diagnosis of LUNG NODULE.  The various methods of treatment have been discussed with the patient and family. After consideration of risks, benefits and other options for treatment, the patient has consented to  Procedure(s): [place] (N/A) as a surgical intervention.  The patient's history has been reviewed, patient examined, no change in status, stable for surgery.  I have reviewed the patient's chart and labs.  Questions were answered to the patient's satisfaction.    Patient seen in pre-op. All questions answered. No barriers to proceed.   [place]

## 2020-04-23 NOTE — Anesthesia Preprocedure Evaluation (Addendum)
Anesthesia Evaluation  Patient identified by MRN, date of birth, ID band Patient awake    Reviewed: Allergy & Precautions, NPO status , Patient's Chart, lab work & pertinent test results  Airway Mallampati: III  TM Distance: >3 FB Neck ROM: Full    Dental no notable dental hx.    Pulmonary former smoker,    Pulmonary exam normal breath sounds clear to auscultation       Cardiovascular negative cardio ROS Normal cardiovascular exam Rhythm:Regular Rate:Normal     Neuro/Psych Anxiety negative neurological ROS     GI/Hepatic Neg liver ROS, GERD  Medicated and Controlled,  Endo/Other  negative endocrine ROS  Renal/GU negative Renal ROS     Musculoskeletal negative musculoskeletal ROS (+)   Abdominal (+) + obese,   Peds  Hematology negative hematology ROS (+)   Anesthesia Other Findings LUNG NODULE  Reproductive/Obstetrics                            Anesthesia Physical Anesthesia Plan  ASA: II  Anesthesia Plan: General   Post-op Pain Management:    Induction: Intravenous  PONV Risk Score and Plan: 3 and Ondansetron, Dexamethasone, Midazolam and Treatment may vary due to age or medical condition  Airway Management Planned: Oral ETT  Additional Equipment:   Intra-op Plan:   Post-operative Plan: Extubation in OR  Informed Consent: I have reviewed the patients History and Physical, chart, labs and discussed the procedure including the risks, benefits and alternatives for the proposed anesthesia with the patient or authorized representative who has indicated his/her understanding and acceptance.     Dental advisory given  Plan Discussed with: CRNA  Anesthesia Plan Comments:        Anesthesia Quick Evaluation

## 2020-04-23 NOTE — Anesthesia Procedure Notes (Signed)
Procedure Name: Intubation Date/Time: 04/23/2020 12:58 PM Performed by: Inda Coke, CRNA Pre-anesthesia Checklist: Patient identified, Emergency Drugs available, Suction available and Patient being monitored Patient Re-evaluated:Patient Re-evaluated prior to induction Oxygen Delivery Method: Circle System Utilized Preoxygenation: Pre-oxygenation with 100% oxygen Induction Type: IV induction Ventilation: Mask ventilation without difficulty Laryngoscope Size: Mac and 3 Grade View: Grade I Tube type: Oral Tube size: 8.0 mm Number of attempts: 1 Airway Equipment and Method: Stylet and Oral airway Placement Confirmation: ETT inserted through vocal cords under direct vision,  positive ETCO2 and breath sounds checked- equal and bilateral Secured at: 22 cm Tube secured with: Tape Dental Injury: Teeth and Oropharynx as per pre-operative assessment

## 2020-04-23 NOTE — Transfer of Care (Signed)
Immediate Anesthesia Transfer of Care Note  Patient: Amanda Bernard  Procedure(s) Performed: VIDEO BRONCHOSCOPY WITH ENDOBRONCHIAL NAVIGATION (N/A ) BRONCHIAL BRUSHINGS BRONCHIAL NEEDLE ASPIRATION BIOPSIES BRONCHIAL BIOPSIES FIDUCIAL MARKER PLACEMENT BRONCHIAL WASHINGS  Patient Location: PACU  Anesthesia Type:General  Level of Consciousness: awake, alert  and oriented  Airway & Oxygen Therapy: Patient Spontanous Breathing and Patient connected to nasal cannula oxygen  Post-op Assessment: Report given to RN and Post -op Vital signs reviewed and stable  Post vital signs: Reviewed and stable  Last Vitals:  Vitals Value Taken Time  BP    Temp    Pulse 98 04/23/20 1422  Resp 16 04/23/20 1422  SpO2 99 % 04/23/20 1422  Vitals shown include unvalidated device data.  Last Pain:  Vitals:   04/23/20 0903  TempSrc:   PainSc: 0-No pain         Complications: No apparent anesthesia complications

## 2020-04-24 LAB — CYTOLOGY - NON PAP

## 2020-04-24 LAB — SURGICAL PATHOLOGY

## 2020-04-25 ENCOUNTER — Telehealth: Payer: Self-pay | Admitting: Pulmonary Disease

## 2020-04-25 NOTE — Telephone Encounter (Signed)
Spoke with pt. She is aware of Brian's response. Nothing further was needed. 

## 2020-04-25 NOTE — Telephone Encounter (Signed)
Spoke with pt. She had a bronch done on Tuesday. States Wednesday afternoon she developed severe diarrhea and nausea. Later Wednesday evening she felt like she was going to pass out but feels like this could have been from dehydration from the diarrhea. Diarrhea has gotten better through out today but she wanted to make sure that this wasn't something to do with the procedure.  Aaron Edelman - please advise. Thanks.

## 2020-04-25 NOTE — Telephone Encounter (Signed)
Pt returning a phone call. Pt can be reached at [phone].

## 2020-04-25 NOTE — Telephone Encounter (Signed)
Based on her history / chart review, I'd say Intermediate.

## 2020-04-25 NOTE — Telephone Encounter (Signed)
Thanks for letting us know.   Santiago Glad, please give her a call tomorrow morning, to see if she needs to come in for IVF. Thanks.   Truitt Merle MD

## 2020-04-25 NOTE — Telephone Encounter (Signed)
04/25/2020  Sorry to hear the patient is having the symptoms. Unlikely that the symptoms are directly related to the bronchoscopy. More likely this is related to the cancer of the sigmoid colon that she is followed by Dr. Burr Medico regarding. Has she followed up with oncology?  No new recommendations at this time. Continue to monitor. If symptoms worsen she needs to present to urgent care or the emergency room for further evaluation. She needs to also contact oncology regarding her symptoms for close follow-up.  I will route to Dr. Valeta Harms as Juluis Rainier. I will also route to Dr. Burr Medico.   Wyn Quaker, FNP

## 2020-04-25 NOTE — Telephone Encounter (Signed)
LMTCB x1 for pt.  

## 2020-04-25 NOTE — Telephone Encounter (Signed)
Called and spoke with [facility]. They are needing to know the pt's pre-test maligency risk. Low - < 10% Intermediate - 10-60% High - > 60% This information was left blank of the requisition form that was sent to them.  RB - would you mind answering this since Dr. Valeta Harms is not available?

## 2020-04-26 ENCOUNTER — Telehealth: Payer: Self-pay

## 2020-04-26 NOTE — Telephone Encounter (Signed)
Followed up with patient per Dr Ernestina Penna in basket request to see if she may need some IVF. Patient stated that she is doing a little better. She still has some diarrhea but she is making sure that she is drinking plenty of fluids. I let patient know if anything changes and she starts to not feel well again make sure that she gives [place] a call. Patient verbalized understanding. No further problems or concerns at this time.

## 2020-04-26 NOTE — Telephone Encounter (Signed)
Spoke with [employer]. She has been given this information.

## 2020-05-01 ENCOUNTER — Other Ambulatory Visit: Payer: Self-pay

## 2020-05-01 ENCOUNTER — Ambulatory Visit (INDEPENDENT_AMBULATORY_CARE_PROVIDER_SITE_OTHER): Payer: BC Managed Care – PPO | Admitting: Adult Health

## 2020-05-01 ENCOUNTER — Encounter: Payer: Self-pay | Admitting: Adult Health

## 2020-05-01 DIAGNOSIS — C187 Malignant neoplasm of sigmoid colon: Secondary | ICD-10-CM | POA: Diagnosis not present

## 2020-05-01 DIAGNOSIS — R918 Other nonspecific abnormal finding of lung field: Secondary | ICD-10-CM

## 2020-05-01 DIAGNOSIS — R911 Solitary pulmonary nodule: Secondary | ICD-10-CM

## 2020-05-01 NOTE — Assessment & Plan Note (Signed)
15 mm right lower lobe lung nodule with low grade hypermetabolic activity (SUV 3.0) on PET scan. Patient is a former smoker. Recently diagnosed colon cancer stage I. Navigational bronchoscopy on 04/23/2020 with negative cytology and pathology for malignant cells. We discussed her test results. We discussed serial follow-up of her CT which will be done in 4 months.  Plan  Patient Instructions  Set up CT chest without contrast in 4 months .  Follow up with Dr. Valeta Harms in 4 months and As needed

## 2020-05-01 NOTE — Patient Instructions (Signed)
Set up CT chest without contrast in 4 months .  Follow up with Dr. Valeta Harms in 4 months and As needed

## 2020-05-01 NOTE — Assessment & Plan Note (Signed)
Continue follow-up with oncology and GI

## 2020-05-01 NOTE — Progress Notes (Signed)
@Patient  ID: Amanda Bernard, female    DOB: 1971-04-11, 49 y.o.   MRN: [id number]  Chief Complaint  Patient presents with  . Follow-up    Lung nodule     Referring provider: No ref. provider found  HPI: 49 year old female former smoker seen for right lower lobe lung nodule Recently diagnosed adenocarcinoma sigmoid colon 01/2020 (Stage 1 , s/p polypectomy)   TEST/EVENTS :  03/08/20 -CT CAP w contrast 03/08/20 IMPRESSION: 1. 15 mm right lower lobe pulmonary nodule is indeterminate. A solitary pulmonary metastatic focus is possible but probably unlikely. PET-CT versus follow-up chest CT in 3-4 months. 2. No mediastinal or hilar mass or adenopathy. 3. No obvious colon lesion. 4. No findings for metastatic disease involving the abdomen/pelvis. 5. Uterine fibroid. 6. Bilateral pars defects at L5 with a grade 1 spondylolisthesis. There also appears to be a ununited right pedicle fracture at L5.  03/27/20 PET scan  The right lower lobe lung nodule has low-grade metabolic activity about the level of the blood pool, maximum SUV 3.0. Low-grade malignancy such as neuroendocrine tumor not excluded. 2. No significant activity in the sigmoid colon region of the liver, or retroperitoneum. Given this lack of evidence of spread, I am skeptical that the right lower lobe nodule is due to metastatic disease from the sigmoid colon lesion. 3. Other imaging findings of potential clinical significance: Mild chronic left maxillary sinusitis. Aortic Atherosclerosis (ICD10-I70.0). 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   05/01/2020 Follow up : Lung nodule  Patient returns for a 2-week follow-up, patient was seen last visit for a pulmonary consult for right lower lobe lung nodule. Patient had a screening colonoscopy in February 2021 that revealed colon polyp with invasive  adenocarcinoma. She underwent polypectomy. Felt to have stage I disease. Work-up with a CT abdomen and pelvis showed no mediastinal or hilar adenopathy, no obvious colon lesion no evidence of metastatic disease in the abdomen or pelvis. There was a 15 mm right lower lobe nodule she has been seen by oncology with recommendations for continued surveillance and has an upcoming colonoscopy. PET scan showed low-grade metabolic activity in the right lower lobe SUV 3.0. Patient was set up for a navigational bronchoscopy done on 04/23/2020 cytology and pathology was negative for malignancy cells. Postprocedure patient said that she had no increased cough congestion has some very mild blood-tinged mucus that last for couple hours. However 24 hours after the procedure she had severe incapacitating watery diarrhea that lasted for about 36 hours. This improved with symptomatic treatment at home. She had no bloody diarrhea. She says she is feeling better and back to normal bowel movements and fluid and food intake.   No Known Allergies  Immunization History  Administered Date(s) Administered  . Influenza,inj,Quad PF,6-35 Mos 09/07/2019  . Influenza,inj,quad, With Preservative 09/08/2017, 09/01/2018  . Moderna SARS-COVID-2 Vaccination 02/20/2020, 03/19/2020    Past Medical History:  Diagnosis Date  . Anxiety   . Colon cancer ([place])   . GERD (gastroesophageal reflux disease)   . Lung nodule   . Obesity     Tobacco History: Social History   Tobacco Use  Smoking Status Former Smoker  . Packs/day: 1.00  . Years: 25.00  . Pack years: 25.00  . Quit date: 12/07/2009  . Years since quitting: 10.4  Smokeless Tobacco Never Used   Counseling given: Not Answered   Outpatient Medications Prior to Visit  Medication Sig Dispense Refill  . B Complex Vitamins (VITAMIN B COMPLEX 100 IJ) Take 1 tablet by mouth daily.     . Calcium Carb-Cholecalciferol (CALCIUM 1000 + D PO) Take 1,000 mg by mouth daily.     .  cetirizine (ZYRTEC) 10 MG tablet Take 10 mg by mouth daily.    . Cholecalciferol (VITAMIN D3 PO) Take 1 tablet by mouth daily.    . COLLAGEN PO Take 1 tablet by mouth daily. Peptides    . famotidine (PEPCID AC MAXIMUM STRENGTH) 20 MG tablet Take 20 mg by mouth daily.    . Multiple Vitamins-Minerals (HAIR SKIN AND NAILS FORMULA PO) Take 1 tablet by mouth daily.     . Multiple Vitamins-Minerals (MULTIVITAMIN ADULT EXTRA C PO) Take 1 tablet by mouth daily.     . Omega-3 Fatty Acids (FISH OIL) 1000 MG CAPS Take 1,000 mg by mouth daily.      No facility-administered medications prior to visit.     Review of Systems:   Constitutional:   No  weight loss, night sweats,  Fevers, chills, fatigue, or  lassitude.  HEENT:   No headaches,  Difficulty swallowing,  Tooth/dental problems, or  Sore throat,                No sneezing, itching, ear ache, nasal congestion, post nasal drip,   CV:  No chest pain,  Orthopnea, PND, swelling in lower extremities, anasarca, dizziness, palpitations, syncope.   GI  No heartburn, indigestion, abdominal pain, nausea, vomiting, diarrhea, change in bowel habits, loss of appetite, bloody stools.   Resp: No shortness of breath with exertion or at rest.  No excess mucus, no productive cough,  No non-productive cough,  No coughing up of blood.  No change in color of mucus.  No wheezing.  No chest wall deformity  Skin: no rash or lesions.  GU: no dysuria, change in color of urine, no urgency or frequency.  No flank pain, no hematuria   MS:  No joint pain or swelling.  No decreased range of motion.  No back pain.    Physical Exam  BP 132/82 (BP Location: Left Arm, Cuff Size: Normal)   Pulse 61   Temp 97.8 F (36.6 C) (Oral)   Ht 5' 5.5" (1.664 m)   Wt 215 lb 9.6 oz (97.8 kg)   SpO2 97%   BMI 35.33 kg/m   GEN: A/Ox3; pleasant , NAD, well nourished    HEENT:  [place]/AT,  EACs-clear, TMs-wnl, NOSE-clear, THROAT-clear, no lesions, no postnasal drip or exudate noted.    NECK:  Supple w/ fair ROM; no JVD; normal carotid impulses w/o bruits; no thyromegaly or nodules palpated; no lymphadenopathy.    RESP  Clear  P & A; w/o, wheezes/ rales/ or rhonchi. no accessory muscle use, no dullness to percussion  CARD:  RRR, no m/r/g, no peripheral edema, pulses intact, no cyanosis or clubbing.  GI:   Soft & nt; nml bowel sounds; no organomegaly or masses detected.   Musco: Warm bil, no deformities or joint swelling noted.   Neuro: alert, no focal deficits noted.    Skin: Warm, no lesions or rashes    Lab Results:  CBC    Component Value Date/Time   WBC 6.2 04/19/2020 1038   RBC 4.76 04/19/2020 1038   HGB 14.5 04/19/2020 1038   HCT 43.7 04/19/2020 1038   PLT 276.0 04/19/2020 1038   MCV 91.9 04/19/2020 1038   MCHC 33.2 04/19/2020 1038  RDW 14.2 04/19/2020 1038   LYMPHSABS 2.2 04/19/2020 1038   MONOABS 0.4 04/19/2020 1038   EOSABS 0.3 04/19/2020 1038   BASOSABS 0.1 04/19/2020 1038    BMET    Component Value Date/Time   NA 139 04/19/2020 1038   K 3.7 04/19/2020 1038   CL 104 04/19/2020 1038   CO2 28 04/19/2020 1038   GLUCOSE 110 (H) 04/19/2020 1038   BUN 9 04/19/2020 1038   CREATININE 0.64 04/19/2020 1038   CALCIUM 8.9 04/19/2020 1038    BNP No results found for: BNP  ProBNP No results found for: PROBNP  Imaging: DG CHEST PORT 1 VIEW  Result Date: 04/23/2020 CLINICAL DATA:  Status post bronchoscopy. EXAM: PORTABLE CHEST 1 VIEW COMPARISON:  None. FINDINGS: The heart size and mediastinal contours are within normal limits. Both lungs are clear. No pneumothorax or pleural effusion is noted. The visualized skeletal structures are unremarkable. IMPRESSION: No active disease. Electronically Signed   By: Marijo Conception M.D.   On: 04/23/2020 14:58   DG C-ARM BRONCHOSCOPY  Result Date: 04/23/2020 C-ARM BRONCHOSCOPY: Fluoroscopy was utilized by the requesting physician.  No radiographic interpretation.      No flowsheet data found.  No  results found for: NITRICOXIDE      Assessment & Plan:   Nodule of lower lobe of right lung 15 mm right lower lobe lung nodule with low grade hypermetabolic activity (SUV 3.0) on PET scan. Patient is a former smoker. Recently diagnosed colon cancer stage I. Navigational bronchoscopy on 04/23/2020 with negative cytology and pathology for malignant cells. We discussed her test results. We discussed serial follow-up of her CT which will be done in 4 months.  Plan  Patient Instructions  Set up CT chest without contrast in 4 months .  Follow up with Dr. Valeta Harms in 4 months and As needed       Cancer of sigmoid colon [facility]) Continue follow-up with oncology and GI     Rexene Edison, NP 05/01/2020

## 2020-05-06 DIAGNOSIS — R911 Solitary pulmonary nodule: Secondary | ICD-10-CM | POA: Diagnosis not present

## 2020-05-14 NOTE — Progress Notes (Signed)
Thanks for seeing her s/p [place], DO [facility] 05/14/2020 5:08 PM

## 2020-05-20 DIAGNOSIS — R2231 Localized swelling, mass and lump, right upper limb: Secondary | ICD-10-CM | POA: Diagnosis not present

## 2020-05-20 DIAGNOSIS — W57XXXA Bitten or stung by nonvenomous insect and other nonvenomous arthropods, initial encounter: Secondary | ICD-10-CM | POA: Diagnosis not present

## 2020-05-20 DIAGNOSIS — S60464A Insect bite (nonvenomous) of right ring finger, initial encounter: Secondary | ICD-10-CM | POA: Diagnosis not present

## 2020-06-26 DIAGNOSIS — D124 Benign neoplasm of descending colon: Secondary | ICD-10-CM | POA: Diagnosis not present

## 2020-06-26 DIAGNOSIS — K621 Rectal polyp: Secondary | ICD-10-CM | POA: Diagnosis not present

## 2020-06-26 DIAGNOSIS — Z1211 Encounter for screening for malignant neoplasm of colon: Secondary | ICD-10-CM | POA: Diagnosis not present

## 2020-06-26 DIAGNOSIS — D122 Benign neoplasm of ascending colon: Secondary | ICD-10-CM | POA: Diagnosis not present

## 2020-06-26 DIAGNOSIS — K6289 Other specified diseases of anus and rectum: Secondary | ICD-10-CM | POA: Diagnosis not present

## 2020-06-26 DIAGNOSIS — K6389 Other specified diseases of intestine: Secondary | ICD-10-CM | POA: Diagnosis not present

## 2020-06-26 DIAGNOSIS — Z86010 Personal history of colonic polyps: Secondary | ICD-10-CM | POA: Diagnosis not present

## 2020-06-26 DIAGNOSIS — D125 Benign neoplasm of sigmoid colon: Secondary | ICD-10-CM | POA: Diagnosis not present

## 2020-06-26 DIAGNOSIS — Z85038 Personal history of other malignant neoplasm of large intestine: Secondary | ICD-10-CM | POA: Diagnosis not present

## 2020-06-26 DIAGNOSIS — K635 Polyp of colon: Secondary | ICD-10-CM | POA: Diagnosis not present

## 2020-08-02 ENCOUNTER — Telehealth: Payer: Self-pay

## 2020-08-02 NOTE — Telephone Encounter (Signed)
I reviewed Amanda Bernard's message with Dr. Burr Medico. Per dr. Burr Medico last PET was 03/27/2020. It is too early to do CT AP.  She will order to be done in 03/2021.  Amanda Bernard verbalized understanding.

## 2020-08-02 NOTE — Telephone Encounter (Signed)
Ms Roscoe left vm stating Dr. Valeta Harms has ordered a Ct chest, scheduled for 08/19/20.  She states she needs Dr. Burr Medico to order Ct of abd so the scans can be "coordinated". Dr. Burr Medico note states f/u open.  Forwarded to Dr. Burr Medico

## 2020-08-19 ENCOUNTER — Ambulatory Visit
Admission: RE | Admit: 2020-08-19 | Discharge: 2020-08-19 | Disposition: A | Discharge status: Home / Self Care | Payer: BC Managed Care – PPO | Source: Ambulatory Visit | Attending: Adult Health | Admitting: Adult Health

## 2020-08-19 DIAGNOSIS — J984 Other disorders of lung: Secondary | ICD-10-CM | POA: Diagnosis not present

## 2020-08-19 DIAGNOSIS — I700 Atherosclerosis of aorta: Secondary | ICD-10-CM | POA: Diagnosis not present

## 2020-08-19 DIAGNOSIS — R918 Other nonspecific abnormal finding of lung field: Secondary | ICD-10-CM

## 2020-08-19 DIAGNOSIS — R911 Solitary pulmonary nodule: Secondary | ICD-10-CM | POA: Diagnosis not present

## 2020-08-21 NOTE — Progress Notes (Signed)
Patient called with results of recent CT scan. Provider notes reviewed with patient. Follow up appointment made with Dr. Valeta Harms. Patient verbalized understanding of results and ongoing plan of care.

## 2020-09-10 DIAGNOSIS — Z01419 Encounter for gynecological examination (general) (routine) without abnormal findings: Secondary | ICD-10-CM | POA: Diagnosis not present

## 2020-09-10 DIAGNOSIS — Z6835 Body mass index (BMI) 35.0-35.9, adult: Secondary | ICD-10-CM | POA: Diagnosis not present

## 2020-09-10 DIAGNOSIS — Z1211 Encounter for screening for malignant neoplasm of colon: Secondary | ICD-10-CM | POA: Diagnosis not present

## 2020-09-10 DIAGNOSIS — Z1231 Encounter for screening mammogram for malignant neoplasm of breast: Secondary | ICD-10-CM | POA: Diagnosis not present

## 2020-09-19 ENCOUNTER — Encounter: Payer: Self-pay | Admitting: Pulmonary Disease

## 2020-09-19 ENCOUNTER — Ambulatory Visit (INDEPENDENT_AMBULATORY_CARE_PROVIDER_SITE_OTHER): Payer: BC Managed Care – PPO | Admitting: Pulmonary Disease

## 2020-09-19 ENCOUNTER — Other Ambulatory Visit: Payer: Self-pay

## 2020-09-19 VITALS — BP 132/72 | HR 73 | Temp 97.4°F | Ht 65.0 in | Wt 216.4 lb

## 2020-09-19 DIAGNOSIS — C187 Malignant neoplasm of sigmoid colon: Secondary | ICD-10-CM | POA: Diagnosis not present

## 2020-09-19 DIAGNOSIS — Z9889 Other specified postprocedural states: Secondary | ICD-10-CM

## 2020-09-19 DIAGNOSIS — R918 Other nonspecific abnormal finding of lung field: Secondary | ICD-10-CM | POA: Diagnosis not present

## 2020-09-19 DIAGNOSIS — R911 Solitary pulmonary nodule: Secondary | ICD-10-CM | POA: Diagnosis not present

## 2020-09-19 DIAGNOSIS — Z23 Encounter for immunization: Secondary | ICD-10-CM | POA: Diagnosis not present

## 2020-09-19 DIAGNOSIS — R942 Abnormal results of pulmonary function studies: Secondary | ICD-10-CM | POA: Diagnosis not present

## 2020-09-19 NOTE — Patient Instructions (Addendum)
Thank you for visiting Dr. Valeta Harms at [facility]. Today we recommend the following:  Orders Placed This Encounter  Procedures  . CT Super D Chest Wo Contrast  . Flu Vaccine QUAD 36+ mos IM   Return in about 6 months (around 03/20/2021) for w/ Dr. Valeta Harms .    Please do your part to reduce the spread of COVID-19.

## 2020-09-19 NOTE — Progress Notes (Signed)
Synopsis: Referred in May 2021 for pulmonary nodule by No ref. provider found  Subjective:   PATIENT ID: Amanda Bernard GENDER: female DOB: Oct 27, 1971, MRN: [id number]  Chief Complaint  Patient presents with  . Follow-up    lung nodule, no cough no wheeze no shortness of breath     This is a 49 year old female, past medical history of sigmoid colon cancer and newly diagnosed right lung nodule.  Patient underwent biopsies and February 2021 which revealed an invasive adenocarcinoma.  Patient's images were reviewed at medical thoracic oncology conference and decision was made for biopsy of the lower lobe lung nodule.  It did have low-level SUV uptake on PET imaging.  Patient was referred to pulmonary for navigational bronchoscopy evaluation.  OV 04/17/2020: Patient with no respiratory complaints today.  Little anxious about everything that has been going on recently.  Is aware that she was referred here for bronchoscopy and biopsy.  We discussed this today in the office with detail.  Patient is a non-smoker.  OV 09/19/2020: Patient here today for follow-up regarding lung nodules.  Since last office visit with me patient underwent navigational bronchoscopy in May 2021.  The bronchoscopy biopsies had negative cytology.  She was previously diagnosed with colon cancer.  Bronchoscopy was done in an effort to potentially rule out metastatic disease.  All pathology specimens from navigational bronchoscopy were negative for malignancy she had a repeat noncontrasted CT of the chest on 08/19/2020.  This revealed stability of the 1.6 x 1.6 x 1 cm right lower lobe pulmonary nodule.  Patient is seen today.  Discussed all of her recent findings on CT imaging.  Also reviewed again biopsy results.  And plan potential plans for follow-up.   Past Medical History:  Diagnosis Date  . Anxiety   . Colon cancer ([place])   . GERD (gastroesophageal reflux disease)   . Lung nodule   . Obesity      Family History   Problem Relation Age of Onset  . Cancer Paternal Uncle        brain cancer   . Cancer Maternal Grandmother 40       ovarian cancer   . Cancer Paternal Grandfather        lung cancer  . Cancer Child 1       testicular cancer      Past Surgical History:  Procedure Laterality Date  . BRONCHIAL BIOPSY  04/23/2020   Procedure: BRONCHIAL BIOPSIES;  Surgeon: Garner Nash, DO;  Location: [facility];  Service: Pulmonary;;  . BRONCHIAL BRUSHINGS  04/23/2020   Procedure: BRONCHIAL BRUSHINGS;  Surgeon: Garner Nash, DO;  Location: [facility];  Service: Pulmonary;;  . BRONCHIAL NEEDLE ASPIRATION BIOPSY  04/23/2020   Procedure: BRONCHIAL NEEDLE ASPIRATION BIOPSIES;  Surgeon: Garner Nash, DO;  Location: [facility];  Service: Pulmonary;;  . BRONCHIAL WASHINGS  04/23/2020   Procedure: BRONCHIAL WASHINGS;  Surgeon: Garner Nash, DO;  Location: [facility];  Service: Pulmonary;;  . CESAREAN SECTION     x 2  . COLONOSCOPY    . FIDUCIAL MARKER PLACEMENT  04/23/2020   Procedure: FIDUCIAL MARKER PLACEMENT;  Surgeon: Garner Nash, DO;  Location: [facility];  Service: Pulmonary;;  . TUBAL LIGATION    . VIDEO BRONCHOSCOPY WITH ENDOBRONCHIAL NAVIGATION N/A 04/23/2020   Procedure: VIDEO BRONCHOSCOPY WITH ENDOBRONCHIAL NAVIGATION;  Surgeon: Garner Nash, DO;  Location: [facility];  Service: Pulmonary;  Laterality: N/A;  . WISDOM TOOTH EXTRACTION  Social History   Socioeconomic History  . Marital status: Soil scientist    Spouse name: Not on file  . Number of children: 3  . Years of education: Not on file  . Highest education level: Not on file  Occupational History  . Not on file  Tobacco Use  . Smoking status: Former Smoker    Packs/day: 1.00    Years: 25.00    Pack years: 25.00    Quit date: 12/07/2009    Years since quitting: 10.7  . Smokeless tobacco: Never Used  Vaping Use  . Vaping Use: Never used  Substance and Sexual Activity  . Alcohol use: Yes     Alcohol/week: 20.0 standard drinks    Types: 20 Cans of beer per week    Comment: for 3 years   . Drug use: Not Currently  . Sexual activity: Yes  Other Topics Concern  . Not on file  Social History Narrative  . Not on file   Social Determinants of Health   Financial Resource Strain:   . Difficulty of Paying Living Expenses: Not on file  Food Insecurity:   . Worried About Charity fundraiser in the Last Year: Not on file  . Ran Out of Food in the Last Year: Not on file  Transportation Needs:   . Lack of Transportation (Medical): Not on file  . Lack of Transportation (Non-Medical): Not on file  Physical Activity:   . Days of Exercise per Week: Not on file  . Minutes of Exercise per Session: Not on file  Stress:   . Feeling of Stress : Not on file  Social Connections:   . Frequency of Communication with Friends and Family: Not on file  . Frequency of Social Gatherings with Friends and Family: Not on file  . Attends Religious Services: Not on file  . Active Member of Clubs or Organizations: Not on file  . Attends Archivist Meetings: Not on file  . Marital Status: Not on file  Intimate Partner Violence:   . Fear of Current or Ex-Partner: Not on file  . Emotionally Abused: Not on file  . Physically Abused: Not on file  . Sexually Abused: Not on file     No Known Allergies   Outpatient Medications Prior to Visit  Medication Sig Dispense Refill  . B Complex Vitamins (VITAMIN B COMPLEX 100 IJ) Take 1 tablet by mouth daily.     . Calcium Carb-Cholecalciferol (CALCIUM 1000 + D PO) Take 1,000 mg by mouth daily.     . Cholecalciferol (VITAMIN D3 PO) Take 1 tablet by mouth daily.    . COLLAGEN PO Take 1 tablet by mouth daily. Peptides    . famotidine (PEPCID AC MAXIMUM STRENGTH) 20 MG tablet Take 20 mg by mouth daily.    Marland Kitchen loratadine (CLARITIN) 10 MG tablet Take 10 mg by mouth daily.    . Multiple Vitamins-Minerals (HAIR SKIN AND NAILS FORMULA PO) Take 1 tablet by mouth  daily.     . Multiple Vitamins-Minerals (MULTIVITAMIN ADULT EXTRA C PO) Take 1 tablet by mouth daily.     . Omega-3 Fatty Acids (FISH OIL) 1000 MG CAPS Take 1,000 mg by mouth daily.     . cetirizine (ZYRTEC) 10 MG tablet Take 10 mg by mouth daily.     No facility-administered medications prior to visit.    Review of Systems  Constitutional: Negative for chills, fever, malaise/fatigue and weight loss.  HENT: Negative for hearing loss, sore  throat and tinnitus.   Eyes: Negative for blurred vision and double vision.  Respiratory: Negative for cough, hemoptysis, sputum production, shortness of breath, wheezing and stridor.   Cardiovascular: Negative for chest pain, palpitations, orthopnea, leg swelling and PND.  Gastrointestinal: Negative for abdominal pain, constipation, diarrhea, heartburn, nausea and vomiting.  Genitourinary: Negative for dysuria, hematuria and urgency.  Musculoskeletal: Negative for joint pain and myalgias.  Skin: Negative for itching and rash.  Neurological: Negative for dizziness, tingling, weakness and headaches.  Endo/Heme/Allergies: Negative for environmental allergies. Does not bruise/bleed easily.  Psychiatric/Behavioral: Negative for depression. The patient is not nervous/anxious and does not have insomnia.   All other systems reviewed and are negative.    Objective:  Physical Exam Vitals reviewed.  Constitutional:      General: She is not in acute distress.    Appearance: She is well-developed. She is obese.  HENT:     Head: Normocephalic and atraumatic.  Eyes:     General: No scleral icterus.    Conjunctiva/sclera: Conjunctivae normal.     Pupils: Pupils are equal, round, and reactive to light.  Neck:     Vascular: No JVD.     Trachea: No tracheal deviation.  Cardiovascular:     Rate and Rhythm: Normal rate and regular rhythm.     Heart sounds: Normal heart sounds. No murmur heard.   Pulmonary:     Effort: Pulmonary effort is normal. No  tachypnea, accessory muscle usage or respiratory distress.     Breath sounds: Normal breath sounds. No stridor. No wheezing, rhonchi or rales.  Abdominal:     General: Bowel sounds are normal. There is no distension.     Palpations: Abdomen is soft.     Tenderness: There is no abdominal tenderness.  Musculoskeletal:        General: No tenderness.     Cervical back: Neck supple.  Lymphadenopathy:     Cervical: No cervical adenopathy.  Skin:    General: Skin is warm and dry.     Capillary Refill: Capillary refill takes less than 2 seconds.     Findings: No rash.  Neurological:     Mental Status: She is alert and oriented to person, place, and time.  Psychiatric:        Behavior: Behavior normal.      Vitals:   09/19/20 0914  BP: 132/72  Pulse: 73  Temp: (!) 97.4 F (36.3 C)  TempSrc: Temporal  SpO2: 99%  Weight: 216 lb 6.4 oz (98.2 kg)  Height: 5\' 5"  (1.651 m)   99% on RA BMI Readings from Last 3 Encounters:  09/19/20 36.01 kg/m  05/01/20 35.33 kg/m  04/23/20 34.91 kg/m   Wt Readings from Last 3 Encounters:  09/19/20 216 lb 6.4 oz (98.2 kg)  05/01/20 215 lb 9.6 oz (97.8 kg)  04/23/20 213 lb (96.6 kg)     CBC    Component Value Date/Time   WBC 6.2 04/19/2020 1038   RBC 4.76 04/19/2020 1038   HGB 14.5 04/19/2020 1038   HCT 43.7 04/19/2020 1038   PLT 276.0 04/19/2020 1038   MCV 91.9 04/19/2020 1038   MCHC 33.2 04/19/2020 1038   RDW 14.2 04/19/2020 1038   LYMPHSABS 2.2 04/19/2020 1038   MONOABS 0.4 04/19/2020 1038   EOSABS 0.3 04/19/2020 1038   BASOSABS 0.1 04/19/2020 1038    Chest Imaging: [facility] pet imaging April 2021: Low-level PET uptake between patient's right lower lobe pulmonary nodule SUV 3.0 possible underlying malignancy. The patient's  images have been independently reviewed by me.    Pulmonary Functions Testing Results: No flowsheet data found.  FeNO: none  Pathology: none  Echocardiogram: none  Heart Catheterization: none     Assessment & Plan:     ICD-10-CM   1. Nodule of lower lobe of right lung  R91.1   2. Cancer of sigmoid colon ([place])  C18.7   3. Abnormal PET of right lung  R94.2   4. Abnormal findings on diagnostic imaging of lung  R91.8   5. S/P bronchoscopy with biopsy  Z98.890   6. Need for immunization against influenza  Z23 Flu Vaccine QUAD 36+ mos IM    Discussion:  This is a 49 year old female recent diagnosis of sigmoid colon cancer.  She is undergoing surveillance of this stage I disease.  During this work-up revealed to have a right lower lobe pulmonary nodule underwent navigational bronchoscopy with nondiagnostic tissue sampling.  She had subsequent follow-up imaging which was done in September here today for follow-up regarding this.  Document stability of the 1.6 x 1.6 nodule.  Due to the features and size of the lesion we discussed today the risks of it being a potential malignancy.  Solitary pulmonary nodule calculator risk puts it in approximately 25% of being of malignancy still.  We discussed various options today to include watchful waiting, repeat imaging and referral to cardiothoracic surgery.  Plan: After thorough discussion we have opted to repeat noncontrasted CT of the chest in 6 months. I will reach out to Dr. Kipp Brood to have him review patient's images.  I think if the lesion stays persistent and/or enlarges we should consider surgical lung biopsy. She would like to wait another 6 months with repeat images. She needs a flu shot today.   Patient return to clinic in 6 months after repeat noncontrasted CT chest.   Current Outpatient Medications:  .  B Complex Vitamins (VITAMIN B COMPLEX 100 IJ), Take 1 tablet by mouth daily. , Disp: , Rfl:  .  Calcium Carb-Cholecalciferol (CALCIUM 1000 + D PO), Take 1,000 mg by mouth daily. , Disp: , Rfl:  .  Cholecalciferol (VITAMIN D3 PO), Take 1 tablet by mouth daily., Disp: , Rfl:  .  COLLAGEN PO, Take 1 tablet by mouth daily. Peptides,  Disp: , Rfl:  .  famotidine (PEPCID AC MAXIMUM STRENGTH) 20 MG tablet, Take 20 mg by mouth daily., Disp: , Rfl:  .  loratadine (CLARITIN) 10 MG tablet, Take 10 mg by mouth daily., Disp: , Rfl:  .  Multiple Vitamins-Minerals (HAIR SKIN AND NAILS FORMULA PO), Take 1 tablet by mouth daily. , Disp: , Rfl:  .  Multiple Vitamins-Minerals (MULTIVITAMIN ADULT EXTRA C PO), Take 1 tablet by mouth daily. , Disp: , Rfl:  .  Omega-3 Fatty Acids (FISH OIL) 1000 MG CAPS, Take 1,000 mg by mouth daily. , Disp: , Rfl:   I spent 45 minutes dedicated to the care of this patient on the date of this encounter to include pre-visit review of records, face-to-face time with the patient discussing conditions above, post visit ordering of testing, clinical documentation with the electronic health record, making appropriate referrals as documented, and communicating necessary findings to members of the patients care team.    Garner Nash, DO [facility] 09/19/2020 9:44 AM

## 2020-09-30 ENCOUNTER — Telehealth: Payer: Self-pay | Admitting: Pulmonary Disease

## 2020-09-30 DIAGNOSIS — R911 Solitary pulmonary nodule: Secondary | ICD-10-CM

## 2020-09-30 NOTE — Telephone Encounter (Signed)
PCCM:  I called and spoke with the patient regarding my discussion with cardiothoracic surgery.   Referral placed to cardiothoracic surgery.   Garner Nash, DO [facility] 09/30/2020 12:03 PM

## 2020-10-04 ENCOUNTER — Institutional Professional Consult (permissible substitution) (INDEPENDENT_AMBULATORY_CARE_PROVIDER_SITE_OTHER): Payer: BC Managed Care – PPO | Admitting: Thoracic Surgery (Cardiothoracic Vascular Surgery)

## 2020-10-04 ENCOUNTER — Other Ambulatory Visit: Payer: Self-pay

## 2020-10-04 ENCOUNTER — Encounter: Payer: Self-pay | Admitting: Thoracic Surgery (Cardiothoracic Vascular Surgery)

## 2020-10-04 VITALS — BP 145/90 | HR 70 | Temp 97.6°F | Resp 20 | Ht 65.0 in | Wt 215.0 lb

## 2020-10-04 DIAGNOSIS — R911 Solitary pulmonary nodule: Secondary | ICD-10-CM | POA: Diagnosis not present

## 2020-10-04 NOTE — Progress Notes (Signed)
[address]       [place] [address]             [phone]                    Amanda Bernard Tea Medical Record #[id number] Date of Birth: 05-23-1971  Referring: Garner Nash, DO Primary Care: Patient, No Pcp Per Primary Cardiologist: No primary care provider on file.  Chief Complaint:    Chief Complaint  Patient presents with  . Lung Lesion    [facility] consult, Chest CT 08/19/20 Hx of colon cancer, PET Scan 03/27/20    History of Present Illness:    Amanda Bernard 49 y.o. female presents for surgical evaluation of her right lower lobe pulmonary nodule.  She was recently diagnosed with early stage colon cancer and during her work-up she was noted to have this right lower lobe pulmonary nodule.  She underwent a navigational bronchoscopy which did not show any malignancy.  She has been sent to me for further evaluation.  She denies any respiratory symptoms.  Her weight has been stable.  She is very hesitant to discuss surgical planning.  She is a former smoker but quit approximately 10 years ago.       Zubrod Score: At the time of surgery this patient's most appropriate activity status/level should be described as: [x]     0    Normal activity, no symptoms []     1    Restricted in physical strenuous activity but ambulatory, able to do out light work []     2    Ambulatory and capable of self care, unable to do work activities, up and about               >50 % of waking hours                              []     3    Only limited self care, in bed greater than 50% of waking hours []     4    Completely disabled, no self care, confined to bed or chair []     5    Moribund   Past Medical History:  Diagnosis Date  . Anxiety   . Colon cancer ([place])   . GERD (gastroesophageal reflux disease)   . Lung nodule   . Obesity     Past Surgical History:  Procedure Laterality Date  . BRONCHIAL BIOPSY  04/23/2020   Procedure: BRONCHIAL BIOPSIES;   Surgeon: Garner Nash, DO;  Location: [facility];  Service: Pulmonary;;  . BRONCHIAL BRUSHINGS  04/23/2020   Procedure: BRONCHIAL BRUSHINGS;  Surgeon: Garner Nash, DO;  Location: [facility];  Service: Pulmonary;;  . BRONCHIAL NEEDLE ASPIRATION BIOPSY  04/23/2020   Procedure: BRONCHIAL NEEDLE ASPIRATION BIOPSIES;  Surgeon: Garner Nash, DO;  Location: [facility];  Service: Pulmonary;;  . BRONCHIAL WASHINGS  04/23/2020   Procedure: BRONCHIAL WASHINGS;  Surgeon: Garner Nash, DO;  Location: [facility];  Service: Pulmonary;;  . CESAREAN SECTION     x 2  . COLONOSCOPY    . FIDUCIAL MARKER PLACEMENT  04/23/2020   Procedure: FIDUCIAL MARKER PLACEMENT;  Surgeon: Garner Nash, DO;  Location: [facility];  Service: Pulmonary;;  . TUBAL LIGATION    . VIDEO BRONCHOSCOPY WITH ENDOBRONCHIAL NAVIGATION N/A 04/23/2020   Procedure: VIDEO BRONCHOSCOPY WITH ENDOBRONCHIAL  NAVIGATION;  Surgeon: Garner Nash, DO;  Location: [facility];  Service: Pulmonary;  Laterality: N/A;  . WISDOM TOOTH EXTRACTION      Family History  Problem Relation Age of Onset  . Cancer Paternal Uncle        brain cancer   . Cancer Maternal Grandmother 40       ovarian cancer   . Cancer Paternal Grandfather        lung cancer  . Cancer Child 1       testicular cancer      Social History   Tobacco Use  Smoking Status Former Smoker  . Packs/day: 1.00  . Years: 25.00  . Pack years: 25.00  . Quit date: 12/07/2009  . Years since quitting: 10.8  Smokeless Tobacco Never Used    Social History   Substance and Sexual Activity  Alcohol Use Yes  . Alcohol/week: 20.0 standard drinks  . Types: 20 Cans of beer per week   Comment: for 3 years      No Known Allergies  Current Outpatient Medications  Medication Sig Dispense Refill  . B Complex Vitamins (VITAMIN B COMPLEX 100 IJ) Take 1 tablet by mouth daily.     . Calcium Carb-Cholecalciferol (CALCIUM 1000 + D PO) Take 1,000 mg by mouth daily.      . Cholecalciferol (VITAMIN D3 PO) Take 1 tablet by mouth daily.    . COLLAGEN PO Take 1 tablet by mouth daily. Peptides    . famotidine (PEPCID AC MAXIMUM STRENGTH) 20 MG tablet Take 20 mg by mouth daily.    Marland Kitchen loratadine (CLARITIN) 10 MG tablet Take 10 mg by mouth daily.    . Multiple Vitamins-Minerals (HAIR SKIN AND NAILS FORMULA PO) Take 1 tablet by mouth daily.     . Multiple Vitamins-Minerals (MULTIVITAMIN ADULT EXTRA C PO) Take 1 tablet by mouth daily.     . Omega-3 Fatty Acids (FISH OIL) 1000 MG CAPS Take 1,000 mg by mouth daily.      No current facility-administered medications for this visit.    Review of Systems  Constitutional: Negative.   Respiratory: Negative.   Cardiovascular: Negative.   Musculoskeletal: Negative.      PHYSICAL EXAMINATION: BP (!) 145/90   Pulse 70   Temp 97.6 F (36.4 C) (Skin)   Resp 20   Ht 5\' 5"  (1.651 m)   Wt 215 lb (97.5 kg)   SpO2 98% Comment: RA  BMI 35.78 kg/m  Physical Exam Constitutional:      Appearance: Normal appearance.  Eyes:     Extraocular Movements: Extraocular movements intact.     Conjunctiva/sclera: Conjunctivae normal.  Cardiovascular:     Rate and Rhythm: Normal rate.  Pulmonary:     Effort: Pulmonary effort is normal. No respiratory distress.  Musculoskeletal:        General: Normal range of motion.     Cervical back: Normal range of motion.  Skin:    General: Skin is warm and dry.  Neurological:     General: No focal deficit present.     Mental Status: She is alert and oriented to person, place, and time.     Diagnostic Studies & Laboratory data:     Recent Radiology Findings:   No results found.     I have independently reviewed the above radiology studies  and reviewed the findings with the patient.   Recent Lab Findings: Lab Results  Component Value Date   WBC 6.2 04/19/2020  HGB 14.5 04/19/2020   HCT 43.7 04/19/2020   PLT 276.0 04/19/2020   GLUCOSE 110 (H) 04/19/2020   ALT 16 04/19/2020    AST 16 04/19/2020   NA 139 04/19/2020   K 3.7 04/19/2020   CL 104 04/19/2020   CREATININE 0.64 04/19/2020   BUN 9 04/19/2020   CO2 28 04/19/2020   INR 1.0 04/19/2020      Assessment / Plan:   49 year old female with a history of smoking, and colon cancer presents with a right lower lobe pulmonary nodule.  On review of her imaging it is quite close to the diaphragm and she is undergone navigational bronchoscopy with a negative biopsy for malignancy.  We discussed several options which included a robotic assisted right lower lobe wedge resection for diagnosis.  Given her smoking history there is a possibility that this may be a primary lung cancer, or this could potentially be metastatic colon cancer which is somewhat unlikely.  She would like some time to think about her options.  She is already scheduled to undergo a repeat CT chest in another few months.  I do think that surgery would be a good option to wedge this out to give her a definitive diagnosis.  She will let us know her ultimate decision.     I  spent 40 minutes with  the patient face to face and greater then 50% of the time was spent in counseling and coordination of care.    Lajuana Matte 10/04/2020 5:32 PM

## 2021-02-10 ENCOUNTER — Encounter: Payer: Self-pay | Admitting: Hematology

## 2021-02-28 ENCOUNTER — Telehealth: Payer: Self-pay | Admitting: Pulmonary Disease

## 2021-02-28 NOTE — Telephone Encounter (Signed)
See MyChart message

## 2021-02-28 NOTE — Telephone Encounter (Signed)
Received a message from patient stating that she is wanting to have her Super D done at [facility] on 03/04/21, the same day as her abdomen CT that was ordered by another provider. She is wanting to know if she can have the order for the Super D to be sent to [facility] so she can do all of the CT scans on the same day. I checked her chart and see there was an order placed back in October for this to be done in April 2022.   PCCs, please advise. Thanks.

## 2021-03-03 ENCOUNTER — Telehealth: Payer: Self-pay | Admitting: Pulmonary Disease

## 2021-03-03 NOTE — Telephone Encounter (Signed)
pt is calling because shes unsure if Dr. Valeta Harms sent order to [facility]..Did let pt know order for CT scan is in,  states she wanted make sure it wont be an issue with insurance because [facility] told her the order was placed in 09/2020. Pt is scheduled for CT scan on 03/04/21 please advise [phone]

## 2021-03-03 NOTE — Telephone Encounter (Signed)
Noted  

## 2021-03-03 NOTE — Telephone Encounter (Deleted)
Pt is scheduled for CT abd/pelvis/chest tomorrow with [facility] ordered by Dr Dema Severin.  I called GI & spoke to [place].  She checked and they will go ahead and do Super D from Dr Orest Dikes order.  They will make disk & send to Dr Valeta Harms.  She said our order is not needed.  Will route back to triage so they can let pt know thru MyChart that the CT she is having tomorrow will include what BI needs.

## 2021-03-03 NOTE — Telephone Encounter (Addendum)
Pt is scheduled for CT abd/pelvis/chest tomorrow with [facility] ordered by Dr Dema Severin.  I called GI & spoke to [place].  She checked and they will go ahead and do Super D from Dr Orest Dikes order.  They will make disk & send to Dr Valeta Harms.  She said our order is not needed.  Will route back to triage so they can let pt know thru MyChart that the CT she is having tomorrow will include what BI needs.

## 2021-03-03 NOTE — Telephone Encounter (Signed)
Called and spoke with patient. Read the message from earlier today to her. She verbalized understanding. Nothing further needed at time of call.

## 2021-03-04 ENCOUNTER — Telehealth: Payer: Self-pay | Admitting: Pulmonary Disease

## 2021-03-04 ENCOUNTER — Other Ambulatory Visit: Payer: BC Managed Care – PPO

## 2021-03-04 ENCOUNTER — Inpatient Hospital Stay: Admission: RE | Admit: 2021-03-04 | Payer: BC Managed Care – PPO | Source: Ambulatory Visit

## 2021-03-04 NOTE — Telephone Encounter (Signed)
Dr Dema Severin ordered CT chest/abd/pelvis.  I called GI yesterday and verified they would be able to do super D & make disk for BI.  Today pt called & states GI wants [place] to precert chest.  I spoke to [place] she had been contacted by GI.  She is working on getting the chest CT authorized.

## 2021-03-05 NOTE — Telephone Encounter (Signed)
precert is finished and placed in order GI is aware [facility] [place]

## 2021-03-11 ENCOUNTER — Other Ambulatory Visit: Payer: BC Managed Care – PPO

## 2021-03-13 ENCOUNTER — Ambulatory Visit
Admission: RE | Admit: 2021-03-13 | Discharge: 2021-03-13 | Disposition: A | Discharge status: Home / Self Care | Payer: BC Managed Care – PPO | Source: Ambulatory Visit | Attending: Pulmonary Disease | Admitting: Pulmonary Disease

## 2021-03-13 ENCOUNTER — Ambulatory Visit
Admission: RE | Admit: 2021-03-13 | Discharge: 2021-03-13 | Disposition: A | Discharge status: Home / Self Care | Payer: BC Managed Care – PPO | Source: Ambulatory Visit | Attending: Surgery | Admitting: Surgery

## 2021-03-13 DIAGNOSIS — C187 Malignant neoplasm of sigmoid colon: Secondary | ICD-10-CM

## 2021-03-13 DIAGNOSIS — R911 Solitary pulmonary nodule: Secondary | ICD-10-CM

## 2021-03-13 MED ORDER — IOPAMIDOL (ISOVUE-300) INJECTION 61%
100.0000 mL | Freq: Once | INTRAVENOUS | Status: AC | PRN
  Administered 2021-03-13: 100 mL via INTRAVENOUS

## 2021-04-10 ENCOUNTER — Encounter: Payer: Self-pay | Admitting: *Deleted

## 2021-05-12 ENCOUNTER — Encounter: Payer: Self-pay | Admitting: Pulmonary Disease

## 2021-05-12 ENCOUNTER — Other Ambulatory Visit: Payer: Self-pay

## 2021-05-12 ENCOUNTER — Ambulatory Visit: Payer: BC Managed Care – PPO | Admitting: Pulmonary Disease

## 2021-05-12 VITALS — BP 112/82 | HR 63 | Temp 97.8°F | Ht 65.0 in | Wt 209.0 lb

## 2021-05-12 DIAGNOSIS — R918 Other nonspecific abnormal finding of lung field: Secondary | ICD-10-CM

## 2021-05-12 DIAGNOSIS — C187 Malignant neoplasm of sigmoid colon: Secondary | ICD-10-CM | POA: Diagnosis not present

## 2021-05-12 DIAGNOSIS — R911 Solitary pulmonary nodule: Secondary | ICD-10-CM

## 2021-05-12 DIAGNOSIS — Z9889 Other specified postprocedural states: Secondary | ICD-10-CM | POA: Diagnosis not present

## 2021-05-12 NOTE — Progress Notes (Signed)
Synopsis: Referred in May 2021 for pulmonary nodule by No ref. provider found  Subjective:   PATIENT ID: Amanda Bernard GENDER: female DOB: 05-12-1971, MRN: [id number]  Chief Complaint  Patient presents with  . Follow-up    Patient feels good overall, no concerns    This is a 51 year old female, past medical history of sigmoid colon cancer and newly diagnosed right lung nodule.  Patient underwent biopsies and February 2021 which revealed an invasive adenocarcinoma.  Patient's images were reviewed at medical thoracic oncology conference and decision was made for biopsy of the lower lobe lung nodule.  It did have low-level SUV uptake on PET imaging.  Patient was referred to pulmonary for navigational bronchoscopy evaluation.  OV 04/17/2020: Patient with no respiratory complaints today.  Little anxious about everything that has been going on recently.  Is aware that she was referred here for bronchoscopy and biopsy.  We discussed this today in the office with detail.  Patient is a non-smoker.  OV 09/19/2020: Patient here today for follow-up regarding lung nodules.  Since last office visit with me patient underwent navigational bronchoscopy in May 2021.  The bronchoscopy biopsies had negative cytology.  She was previously diagnosed with colon cancer.  Bronchoscopy was done in an effort to potentially rule out metastatic disease.  All pathology specimens from navigational bronchoscopy were negative for malignancy she had a repeat noncontrasted CT of the chest on 08/19/2020.  This revealed stability of the 1.6 x 1.6 x 1 cm right lower lobe pulmonary nodule.  Patient is seen today.  Discussed all of her recent findings on CT imaging.  Also reviewed again biopsy results.  And plan potential plans for follow-up.  OV 05/12/2021: Patient here today for follow-up regarding lower lobe lung nodule.  Prior bronchoscopy in May 2021 was negative for cytology.  Did have a previous colon cancer that was resected.   All pathology from the specimen was negative for malignancy.  Fiducial was placed at that time.  CT imaging was complete for follow-up of this nodule in April 2022.  Lung nodule was stable with no change in size still measuring approximately 1.5 cm in largest cross-section.  Patient has no respiratory complaints at this time.  Since last office visit she did have a surgical consultation with Dr. Kipp Brood in October 2021.  They opted for routine surveillance of the nodule if there is any change would consider repeat biopsy or considerations for surgery in the future.  A wedge resection would at least give [place] a definitive diagnosis.   Past Medical History:  Diagnosis Date  . Anxiety   . Colon cancer ([facility])   . GERD (gastroesophageal reflux disease)   . Lung nodule   . Obesity      Family History  Problem Relation Age of Onset  . Cancer Paternal Uncle        brain cancer   . Cancer Maternal Grandmother 40       ovarian cancer   . Cancer Paternal Grandfather        lung cancer  . Cancer Child 1       testicular cancer      Past Surgical History:  Procedure Laterality Date  . BRONCHIAL BIOPSY  04/23/2020   Procedure: BRONCHIAL BIOPSIES;  Surgeon: Garner Nash, DO;  Location: [place] [facility];  Service: Pulmonary;;  . BRONCHIAL BRUSHINGS  04/23/2020   Procedure: BRONCHIAL BRUSHINGS;  Surgeon: Garner Nash, DO;  Location: [place] [facility];  Service: Pulmonary;;  .  BRONCHIAL NEEDLE ASPIRATION BIOPSY  04/23/2020   Procedure: BRONCHIAL NEEDLE ASPIRATION BIOPSIES;  Surgeon: Garner Nash, DO;  Location: [place];  Service: Pulmonary;;  . BRONCHIAL WASHINGS  04/23/2020   Procedure: BRONCHIAL WASHINGS;  Surgeon: Garner Nash, DO;  Location: [place] [facility];  Service: Pulmonary;;  . CESAREAN SECTION     x 2  . COLONOSCOPY    . FIDUCIAL MARKER PLACEMENT  04/23/2020   Procedure: FIDUCIAL MARKER PLACEMENT;  Surgeon: Garner Nash, DO;  Location: [place] [facility];  Service: Pulmonary;;  .  TUBAL LIGATION    . VIDEO BRONCHOSCOPY WITH ENDOBRONCHIAL NAVIGATION N/A 04/23/2020   Procedure: VIDEO BRONCHOSCOPY WITH ENDOBRONCHIAL NAVIGATION;  Surgeon: Garner Nash, DO;  Location: [place];  Service: Pulmonary;  Laterality: N/A;  . WISDOM TOOTH EXTRACTION      Social History   Socioeconomic History  . Marital status: Soil scientist    Spouse name: Not on file  . Number of children: 3  . Years of education: Not on file  . Highest education level: Not on file  Occupational History  . Not on file  Tobacco Use  . Smoking status: Former Smoker    Packs/day: 1.00    Years: 25.00    Pack years: 25.00    Quit date: 12/07/2009    Years since quitting: 11.4  . Smokeless tobacco: Never Used  Vaping Use  . Vaping Use: Never used  Substance and Sexual Activity  . Alcohol use: Yes    Alcohol/week: 20.0 standard drinks    Types: 20 Cans of beer per week    Comment: for 3 years   . Drug use: Not Currently  . Sexual activity: Yes  Other Topics Concern  . Not on file  Social History Narrative  . Not on file   Social Determinants of Health   Financial Resource Strain: Not on file  Food Insecurity: Not on file  Transportation Needs: Not on file  Physical Activity: Not on file  Stress: Not on file  Social Connections: Not on file  Intimate Partner Violence: Not on file     No Known Allergies   Outpatient Medications Prior to Visit  Medication Sig Dispense Refill  . B Complex Vitamins (VITAMIN B COMPLEX 100 IJ) Take 1 tablet by mouth daily.     . Calcium Carb-Cholecalciferol (CALCIUM 1000 + D PO) Take 1,000 mg by mouth daily.     . Cholecalciferol (VITAMIN D3 PO) Take 1 tablet by mouth daily.    . COLLAGEN PO Take 1 tablet by mouth daily. Peptides    . famotidine (PEPCID) 20 MG tablet Take 20 mg by mouth daily.    Marland Kitchen loratadine (CLARITIN) 10 MG tablet Take 10 mg by mouth daily.    . Multiple Vitamins-Minerals (HAIR SKIN AND NAILS FORMULA PO) Take 1 tablet by mouth  daily.     . Multiple Vitamins-Minerals (MULTIVITAMIN ADULT EXTRA C PO) Take 1 tablet by mouth daily.     . Omega-3 Fatty Acids (FISH OIL) 1000 MG CAPS Take 1,000 mg by mouth daily.      No facility-administered medications prior to visit.    Review of Systems  Constitutional: Negative for chills, fever, malaise/fatigue and weight loss.  HENT: Negative for hearing loss, sore throat and tinnitus.   Eyes: Negative for blurred vision and double vision.  Respiratory: Negative for cough, hemoptysis, sputum production, shortness of breath, wheezing and stridor.   Cardiovascular: Negative for chest pain, palpitations, orthopnea, leg swelling and PND.  Gastrointestinal: Negative for abdominal pain, constipation, diarrhea, heartburn, nausea and vomiting.  Genitourinary: Negative for dysuria, hematuria and urgency.  Musculoskeletal: Negative for joint pain and myalgias.  Skin: Negative for itching and rash.  Neurological: Negative for dizziness, tingling, weakness and headaches.  Endo/Heme/Allergies: Negative for environmental allergies. Does not bruise/bleed easily.  Psychiatric/Behavioral: Negative for depression. The patient is not nervous/anxious and does not have insomnia.   All other systems reviewed and are negative.    Objective:  Physical Exam Vitals reviewed.  Constitutional:      General: She is not in acute distress.    Appearance: She is well-developed. She is obese.  HENT:     Head: Normocephalic and atraumatic.  Eyes:     General: No scleral icterus.    Conjunctiva/sclera: Conjunctivae normal.     Pupils: Pupils are equal, round, and reactive to light.  Neck:     Vascular: No JVD.     Trachea: No tracheal deviation.  Cardiovascular:     Rate and Rhythm: Normal rate and regular rhythm.     Heart sounds: Normal heart sounds. No murmur heard.   Pulmonary:     Effort: Pulmonary effort is normal. No tachypnea, accessory muscle usage or respiratory distress.     Breath  sounds: Normal breath sounds. No stridor. No wheezing, rhonchi or rales.  Abdominal:     General: Bowel sounds are normal. There is no distension.     Palpations: Abdomen is soft.     Tenderness: There is no abdominal tenderness.  Musculoskeletal:        General: No tenderness.     Cervical back: Neck supple.  Lymphadenopathy:     Cervical: No cervical adenopathy.  Skin:    General: Skin is warm and dry.     Capillary Refill: Capillary refill takes less than 2 seconds.     Findings: No rash.  Neurological:     Mental Status: She is alert and oriented to person, place, and time.  Psychiatric:        Behavior: Behavior normal.      Vitals:   05/12/21 0926  BP: 112/82  Pulse: 63  Temp: 97.8 F (36.6 C)  TempSrc: Temporal  SpO2: 100%  Weight: 209 lb (94.8 kg)  Height: 5\' 5"  (1.651 m)   100% on RA BMI Readings from Last 3 Encounters:  05/12/21 34.78 kg/m  10/04/20 35.78 kg/m  09/19/20 36.01 kg/m   Wt Readings from Last 3 Encounters:  05/12/21 209 lb (94.8 kg)  10/04/20 215 lb (97.5 kg)  09/19/20 216 lb 6.4 oz (98.2 kg)     CBC    Component Value Date/Time   WBC 6.2 04/19/2020 1038   RBC 4.76 04/19/2020 1038   HGB 14.5 04/19/2020 1038   HCT 43.7 04/19/2020 1038   PLT 276.0 04/19/2020 1038   MCV 91.9 04/19/2020 1038   MCHC 33.2 04/19/2020 1038   RDW 14.2 04/19/2020 1038   LYMPHSABS 2.2 04/19/2020 1038   MONOABS 0.4 04/19/2020 1038   EOSABS 0.3 04/19/2020 1038   BASOSABS 0.1 04/19/2020 1038    Chest Imaging: Medicine pet imaging April 2021: Low-level PET uptake between patient's right lower lobe pulmonary nodule SUV 3.0 possible underlying malignancy. The patient's images have been independently reviewed by me.    CT chest April 2022: Stable 1.5 cm lower lobe pulmonary nodule. The patient's images have been independently reviewed by me.    Pulmonary Functions Testing Results: No flowsheet data found.  FeNO: none  Pathology:  none  Echocardiogram: none  Heart Catheterization: none    Assessment & Plan:     ICD-10-CM   1. Nodule of lower lobe of right lung  R91.1 CT Super D Chest Wo Contrast  2. Cancer of sigmoid colon ([place])  C18.7   3. S/P bronchoscopy with biopsy  Z98.890   4. Abnormal findings on diagnostic imaging of lung  R91.8     Discussion:  This is a 50 year old female, prior diagnosis of sigmoid colon cancer, under surveillance for stage I disease.  During this surveillance had a right lower lobe pulmonary nodule underwent navigational bronchoscopy which was nondiagnostic.  Follow-up CT imaging was complete in April 2022 which documented ongoing stability.  We discussed the potential of underlying malignancy and slow-growing indolent tumor such as carcinoid.  Plan: We discussed the risk benefits alternatives of considering repeat biopsy versus continued watchful waiting. Patient agrees for a conservative approach of continued following. Patient would like to have a repeat noncontrasted CT scan in 1 year. This has been ordered.  Patient will follow-up with [place] in 1 year or as needed after CT imaging complete.    Current Outpatient Medications:  .  B Complex Vitamins (VITAMIN B COMPLEX 100 IJ), Take 1 tablet by mouth daily. , Disp: , Rfl:  .  Calcium Carb-Cholecalciferol (CALCIUM 1000 + D PO), Take 1,000 mg by mouth daily. , Disp: , Rfl:  .  Cholecalciferol (VITAMIN D3 PO), Take 1 tablet by mouth daily., Disp: , Rfl:  .  COLLAGEN PO, Take 1 tablet by mouth daily. Peptides, Disp: , Rfl:  .  famotidine (PEPCID) 20 MG tablet, Take 20 mg by mouth daily., Disp: , Rfl:  .  loratadine (CLARITIN) 10 MG tablet, Take 10 mg by mouth daily., Disp: , Rfl:  .  Multiple Vitamins-Minerals (HAIR SKIN AND NAILS FORMULA PO), Take 1 tablet by mouth daily. , Disp: , Rfl:  .  Multiple Vitamins-Minerals (MULTIVITAMIN ADULT EXTRA C PO), Take 1 tablet by mouth daily. , Disp: , Rfl:  .  Omega-3 Fatty Acids (FISH OIL)  1000 MG CAPS, Take 1,000 mg by mouth daily. , Disp: , Rfl:    Garner Nash, DO [facility] 05/12/2021 9:41 AM

## 2021-05-12 NOTE — Patient Instructions (Signed)
Thank you for visiting Dr. Valeta Harms at [facility]. Today we recommend the following:  Orders Placed This Encounter  Procedures  . CT Super D Chest Wo Contrast   Return in about 10 months (around 03/12/2022) for w/ Dr. Valeta Harms .    Please do your part to reduce the spread of COVID-19.

## 2022-01-24 IMAGING — CT CT ABD-PELV W/ CM
3 series · 14 of 32 positions shown, 18 images · IV contrast (iopamidol)
Comparison: None.

CLINICAL DATA: Recent diagnosis of cancerous colon polyp at
colonoscopy. Staging examination.

EXAM:
CT CHEST, ABDOMEN, AND PELVIS WITH CONTRAST
TECHNIQUE: Multidetector CT imaging of the chest, abdomen and pelvis was
performed following the standard protocol during bolus
administration of intravenous contrast.
CONTRAST:  100mL [id number] IOPAMIDOL ([id number]) INJECTION 61%

[Series 2: chest/abd/pelvis w/cm · axial · 0.80mm/px · z∈[-519,-49]mm · 8 of 114 slices shown]
[im 10/114  soft-tissue]
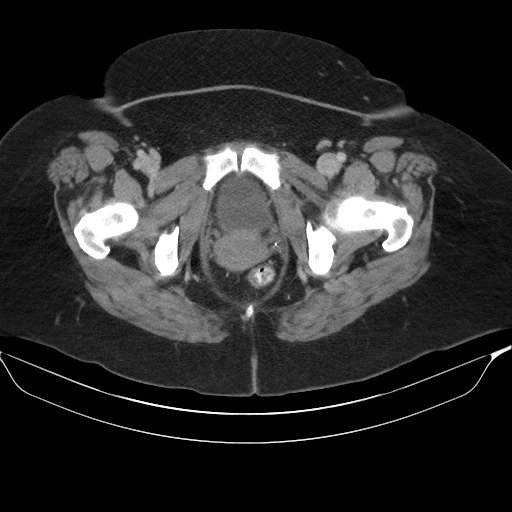
[im 29/114  soft-tissue]
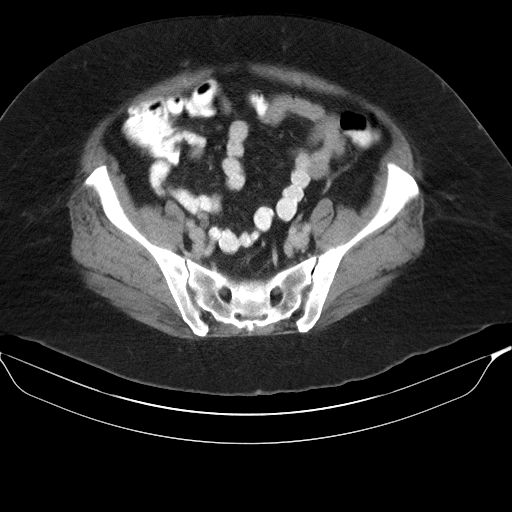
[im 38/114  soft-tissue]
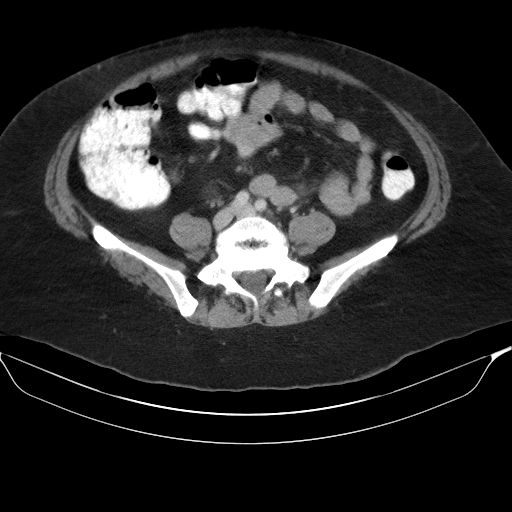
[im 48/114  soft-tissue]
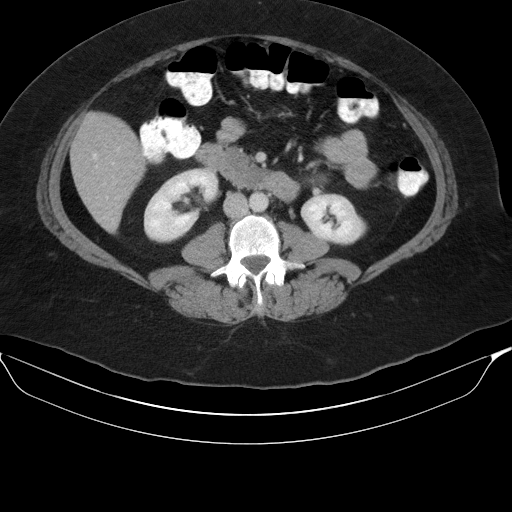
[im 66/114  soft-tissue]
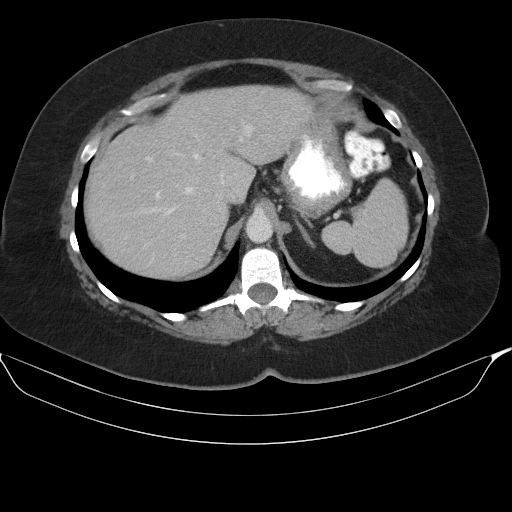
[im 76/114  soft-tissue]
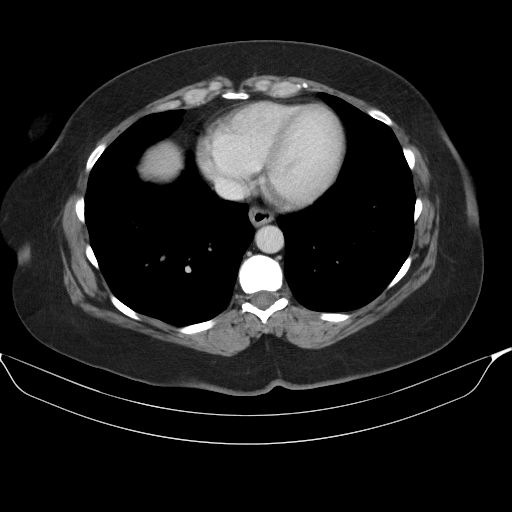
[im 85/114  soft-tissue]
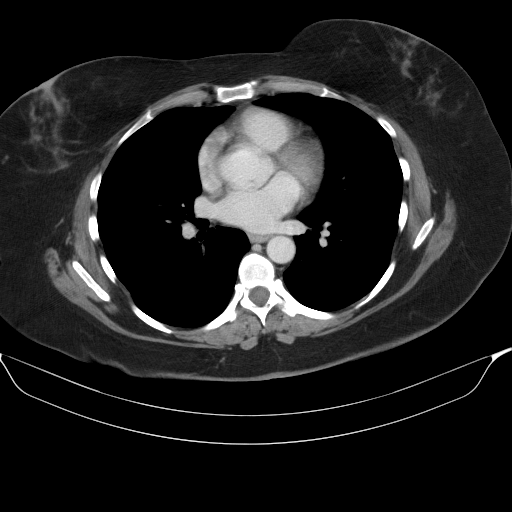
[im 104/114  soft-tissue]
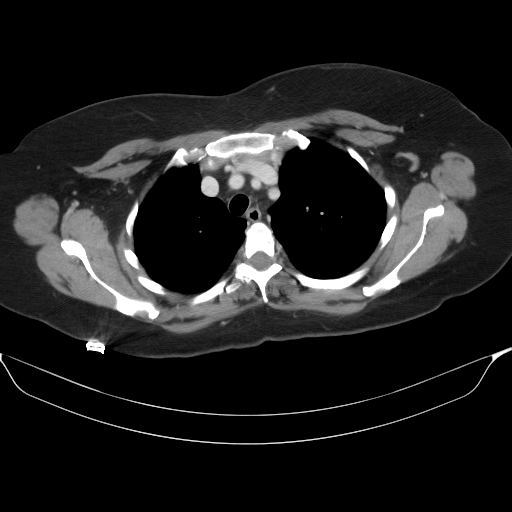

[Series 4: lung · axial · 0.71mm/px · z∈[-253,-181]mm · 3 of 137 slices shown]
[im 10/137  bone]
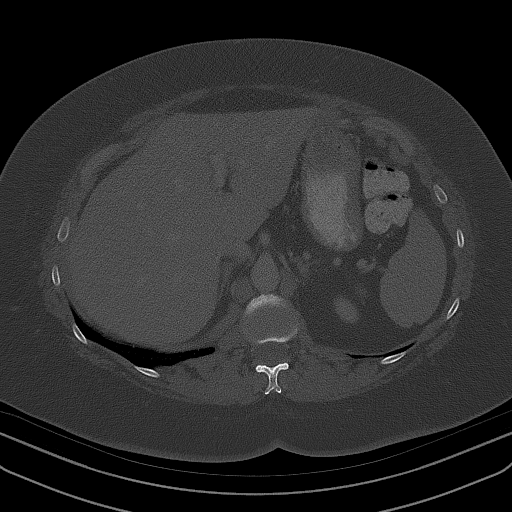
[im 28/137  bone]
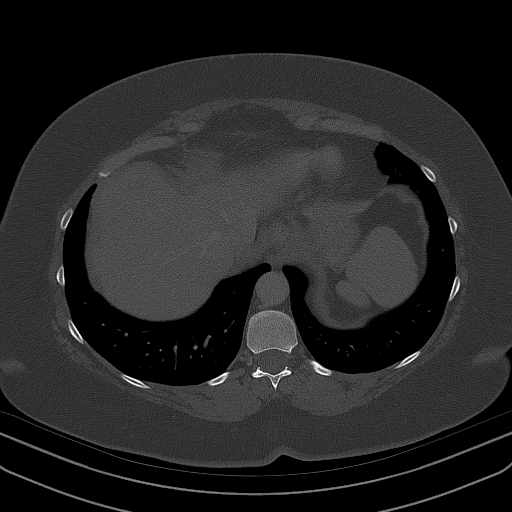
[im 46/137  bone]
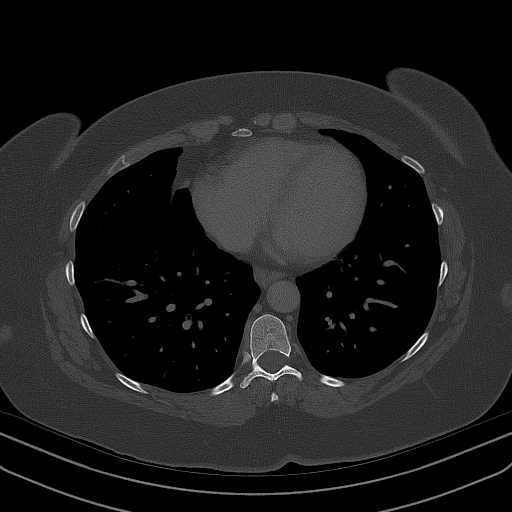

[Series 7: renal delay · axial · delayed · 0.80mm/px · z∈[-375,-230]mm · 3 of 30 slices shown, 7 images]
[im 1/30  soft-tissue]
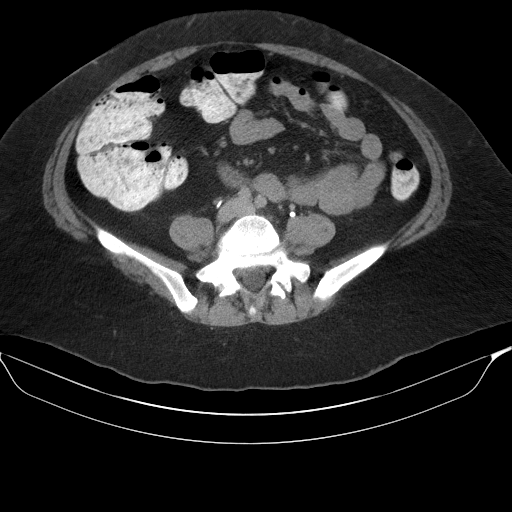
[im 1/30  lung]
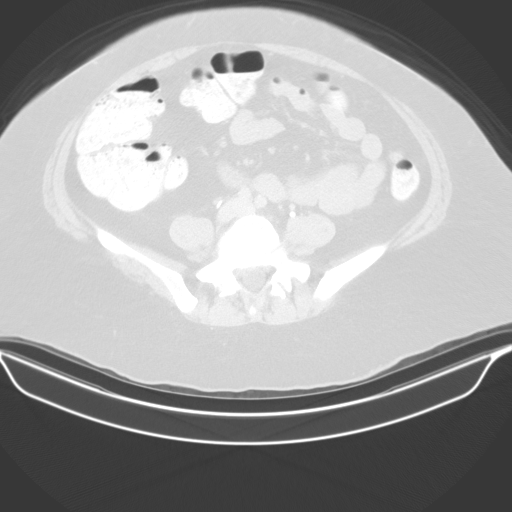
[im 1/30  bone]
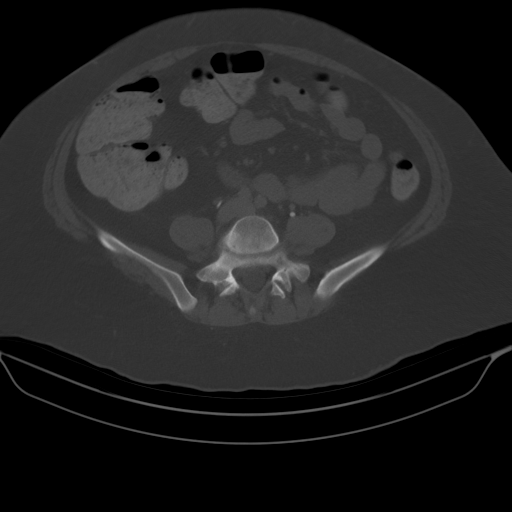
[im 15/30  soft-tissue]
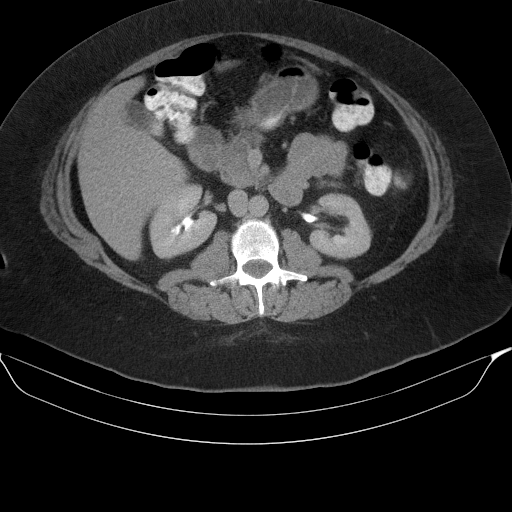
[im 15/30  lung]
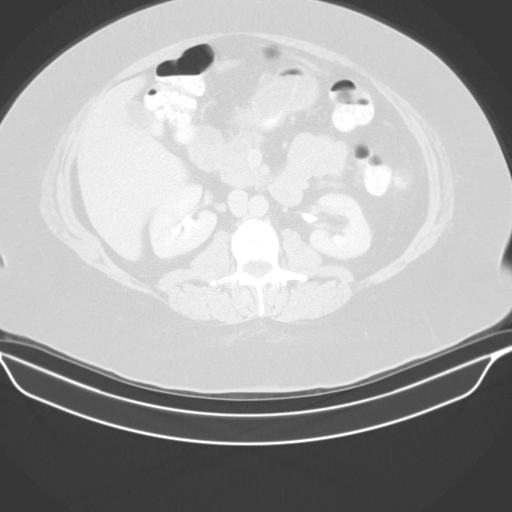
[im 30/30  soft-tissue]
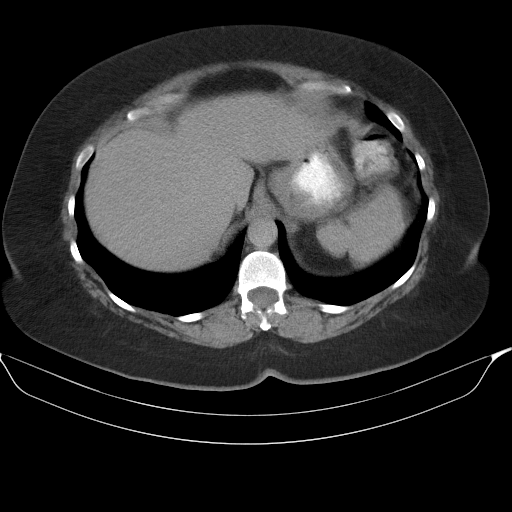
[im 30/30  lung]
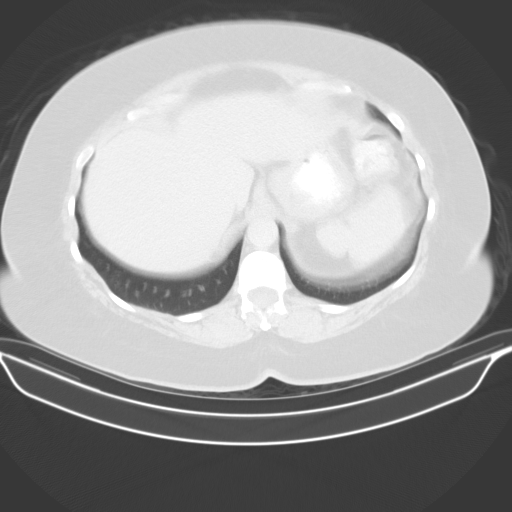

[14 of 32 positions shown; findings below may reference images not displayed]

FINDINGS: CT CHEST FINDINGS

Cardiovascular: The heart is normal in size. No pericardial
effusion. The aorta is normal in caliber. No dissection. No
atherosclerotic calcifications. No definite coronary artery
calcifications.

Mediastinum/Nodes: No mediastinal or hilar mass or lymphadenopathy.
The esophagus is grossly normal.

Lungs/Pleura: There is a smoothly marginated but slightly lobulated
15 mm nodule at the right lung base which is an indeterminate
finding but could not exclude a solitary pulmonary metastasis. I do
not see any other pulmonary nodules or worrisome pulmonary lesions.
There is a streaky band of right middle lobe atelectasis medially.
No infiltrates or effusions. The tracheobronchial tree is
unremarkable.

Musculoskeletal: No breast masses, supraclavicular or axillary
adenopathy. The thyroid gland appears normal except for a small
calcification. The bony thorax is intact. No worrisome bone lesions.

CT ABDOMEN PELVIS FINDINGS

Hepatobiliary: No hepatic lesions to suggest hepatic metastatic
disease. The gallbladder is normal. No common bile duct dilatation.

Pancreas: No mass, inflammation or ductal dilatation.

Spleen: Normal size. No focal lesions.

Adrenals/Urinary Tract: The adrenal glands and kidneys are
unremarkable. No renal, ureteral or bladder calculi or mass.

Stomach/Bowel: The stomach, duodenum, small bowel and colon are
unremarkable. No acute inflammatory changes, mass lesions or
obstructive findings. The terminal ileum is normal.

Vascular/Lymphatic: The aorta is normal in caliber. No dissection.
The branch vessels are patent. The major venous structures are
patent. No mesenteric or retroperitoneal mass or adenopathy. Small
scattered lymph nodes are noted.

Reproductive: Mild retroflexion of the uterus. A posterior
myometrial fibroid is noted. The ovaries are unremarkable.

Other: No pelvic mass or adenopathy. No free pelvic fluid
collections. No inguinal mass or adenopathy. No abdominal wall
hernia or subcutaneous lesions.

Musculoskeletal: Bilateral pars defects are noted at L5 with a grade
1 spondylolisthesis. There also appears to be a ununited right
pedicle fracture at L5.

No worrisome bone lesions.
IMPRESSION: 1. 15 mm right lower lobe pulmonary nodule is indeterminate. A
solitary pulmonary metastatic focus is possible but probably
unlikely. PET-CT versus follow-up chest CT in 3-4 months.
2. No mediastinal or hilar mass or adenopathy.
3. No obvious colon lesion.
4. No findings for metastatic disease involving the abdomen/pelvis.
5. Uterine fibroid.
6. Bilateral pars defects at L5 with a grade 1 spondylolisthesis.
There also appears to be a ununited right pedicle fracture at L5.

## 2022-02-25 ENCOUNTER — Ambulatory Visit
Admission: RE | Admit: 2022-02-25 | Discharge: 2022-02-25 | Disposition: A | Discharge status: Home / Self Care | Payer: 59 | Source: Ambulatory Visit | Attending: Pulmonary Disease | Admitting: Pulmonary Disease

## 2022-02-25 DIAGNOSIS — R911 Solitary pulmonary nodule: Secondary | ICD-10-CM | POA: Diagnosis not present

## 2022-03-02 ENCOUNTER — Ambulatory Visit (INDEPENDENT_AMBULATORY_CARE_PROVIDER_SITE_OTHER): Payer: 59 | Admitting: Pulmonary Disease

## 2022-03-02 ENCOUNTER — Other Ambulatory Visit: Payer: Self-pay

## 2022-03-02 ENCOUNTER — Encounter: Payer: Self-pay | Admitting: Pulmonary Disease

## 2022-03-02 VITALS — BP 136/74 | HR 63 | Temp 97.9°F | Ht 65.0 in | Wt 207.6 lb

## 2022-03-02 DIAGNOSIS — Z9889 Other specified postprocedural states: Secondary | ICD-10-CM

## 2022-03-02 DIAGNOSIS — R911 Solitary pulmonary nodule: Secondary | ICD-10-CM | POA: Diagnosis not present

## 2022-03-02 DIAGNOSIS — C187 Malignant neoplasm of sigmoid colon: Secondary | ICD-10-CM

## 2022-03-02 DIAGNOSIS — R918 Other nonspecific abnormal finding of lung field: Secondary | ICD-10-CM | POA: Diagnosis not present

## 2022-03-02 NOTE — Patient Instructions (Signed)
Thank you for visiting Dr. Valeta Harms at [facility]. ?Today we recommend the following: ? ?Orders Placed This Encounter  ?Procedures  ? CT Super D Chest Wo Contrast  ? ?Return in about 1 year (around 03/03/2023) for with APP or Dr. Valeta Harms. ? ? ? ?Please do your part to reduce the spread of COVID-19.  ?

## 2022-03-02 NOTE — Progress Notes (Signed)
a ? ?Synopsis: Referred in May 2021 for pulmonary nodule by No ref. provider found ? ?Subjective:  ? ?PATIENT ID: Amanda Bernard GENDER: female DOB: [birth year], MRN: [id number] ? ?Chief Complaint  ?Patient presents with  ? Follow-up  ?  Follow up. Patient has no complaints.   ? ? ?This is a 52 year old female, past medical history of sigmoid colon cancer and newly diagnosed right lung nodule.  Patient underwent biopsies and February 2021 which revealed an invasive adenocarcinoma.  Patient's images were reviewed at medical thoracic oncology conference and decision was made for biopsy of the lower lobe lung nodule.  It did have low-level SUV uptake on PET imaging.  Patient was referred to pulmonary for navigational bronchoscopy evaluation. ? ?OV 04/17/2020: Patient with no respiratory complaints today.  Little anxious about everything that has been going on recently.  Is aware that she was referred here for bronchoscopy and biopsy.  We discussed this today in the office with detail.  Patient is a non-smoker. ? ?OV 09/19/2020: Patient here today for follow-up regarding lung nodules.  Since last office visit with me patient underwent navigational bronchoscopy in May 2021.  The bronchoscopy biopsies had negative cytology.  She was previously diagnosed with colon cancer.  Bronchoscopy was done in an effort to potentially rule out metastatic disease.  All pathology specimens from navigational bronchoscopy were negative for malignancy she had a repeat noncontrasted CT of the chest on 08/19/2020.  This revealed stability of the 1.6 x 1.6 x 1 cm right lower lobe pulmonary nodule.  Patient is seen today.  Discussed all of her recent findings on CT imaging.  Also reviewed again biopsy results.  And plan potential plans for follow-up. ? ?OV 05/12/2021: Patient here today for follow-up regarding lower lobe lung nodule.  Prior bronchoscopy in May 2021 was negative for cytology.  Did have a previous colon cancer that was resected.  All  pathology from the specimen was negative for malignancy.  Fiducial was placed at that time.  CT imaging was complete for follow-up of this nodule in April 2022.  Lung nodule was stable with no change in size still measuring approximately 1.5 cm in largest cross-section.  Patient has no respiratory complaints at this time.  Since last office visit she did have a surgical consultation with Dr. Kipp Brood in October 2021.  They opted for routine surveillance of the nodule if there is any change would consider repeat biopsy or considerations for surgery in the future.  A wedge resection would at least give [place] a definitive diagnosis. ? ?OV 03/02/2022: Here today for follow-up after recent CT scan of the chest.Patient has had a lung nodule now followed for approximately 2 years recent CT scan of the chest in March 2023 reveals documented stability of this nodule still in the right lower lobe on the apical portion of the diaphragm within the adjacent fiducial marker.  She also saw Dr. Kipp Brood in consultation in the past for consideration for resection.  We opted for repeat biopsy before undergoing any consideration for surgery. ? ? ?Past Medical History:  ?Diagnosis Date  ? Anxiety   ? Colon cancer ([place])   ? GERD (gastroesophageal reflux disease)   ? Lung nodule   ? Obesity   ?  ? ?Family History  ?Problem Relation Age of Onset  ? Cancer Paternal Uncle   ?     brain cancer   ? Cancer Maternal Grandmother 55  ?     ovarian cancer   ?  Cancer Paternal Grandfather   ?     lung cancer  ? Cancer Child 1  ?     testicular cancer   ?  ? ?Past Surgical History:  ?Procedure Laterality Date  ? BRONCHIAL BIOPSY  04/23/2020  ? Procedure: BRONCHIAL BIOPSIES;  Surgeon: Garner Nash, DO;  Location: [facility];  Service: Pulmonary;;  ? BRONCHIAL BRUSHINGS  04/23/2020  ? Procedure: BRONCHIAL BRUSHINGS;  Surgeon: Garner Nash, DO;  Location: [facility];  Service: Pulmonary;;  ? BRONCHIAL NEEDLE ASPIRATION BIOPSY  04/23/2020  ?  Procedure: BRONCHIAL NEEDLE ASPIRATION BIOPSIES;  Surgeon: Garner Nash, DO;  Location: [facility];  Service: Pulmonary;;  ? BRONCHIAL WASHINGS  04/23/2020  ? Procedure: BRONCHIAL WASHINGS;  Surgeon: Garner Nash, DO;  Location: [facility];  Service: Pulmonary;;  ? CESAREAN SECTION    ? x 2  ? COLONOSCOPY    ? FIDUCIAL MARKER PLACEMENT  04/23/2020  ? Procedure: FIDUCIAL MARKER PLACEMENT;  Surgeon: Garner Nash, DO;  Location: [facility];  Service: Pulmonary;;  ? TUBAL LIGATION    ? VIDEO BRONCHOSCOPY WITH ENDOBRONCHIAL NAVIGATION N/A 04/23/2020  ? Procedure: VIDEO BRONCHOSCOPY WITH ENDOBRONCHIAL NAVIGATION;  Surgeon: Garner Nash, DO;  Location: [facility];  Service: Pulmonary;  Laterality: N/A;  ? WISDOM TOOTH EXTRACTION    ? ? ?Social History  ? ?Socioeconomic History  ? Marital status: Soil scientist  ?  Spouse name: Not on file  ? Number of children: 3  ? Years of education: Not on file  ? Highest education level: Not on file  ?Occupational History  ? Not on file  ?Tobacco Use  ? Smoking status: Former  ?  Packs/day: 1.00  ?  Years: 25.00  ?  Pack years: 25.00  ?  Types: Cigarettes  ?  Quit date: 12/07/2009  ?  Years since quitting: 12.2  ? Smokeless tobacco: Never  ?Vaping Use  ? Vaping Use: Never used  ?Substance and Sexual Activity  ? Alcohol use: Yes  ?  Alcohol/week: 20.0 standard drinks  ?  Types: 20 Cans of beer per week  ?  Comment: for 3 years   ? Drug use: Not Currently  ? Sexual activity: Yes  ?Other Topics Concern  ? Not on file  ?Social History Narrative  ? Not on file  ? ?Social Determinants of Health  ? ?Financial Resource Strain: Not on file  ?Food Insecurity: Not on file  ?Transportation Needs: Not on file  ?Physical Activity: Not on file  ?Stress: Not on file  ?Social Connections: Not on file  ?Intimate Partner Violence: Not on file  ?  ? ?No Known Allergies  ? ?Outpatient Medications Prior to Visit  ?Medication Sig Dispense Refill  ? B Complex Vitamins (VITAMIN B COMPLEX  100 IJ) Take 1 tablet by mouth daily.     ? Cholecalciferol (VITAMIN D3 PO) Take 1 tablet by mouth daily.    ? COLLAGEN PO Take 1 tablet by mouth daily. Peptides    ? famotidine (PEPCID) 20 MG tablet Take 20 mg by mouth daily.    ? loratadine (CLARITIN) 10 MG tablet Take 10 mg by mouth daily.    ? Multiple Vitamins-Minerals (MULTIVITAMIN ADULT EXTRA C PO) Take 1 tablet by mouth daily.     ? Omega-3 Fatty Acids (FISH OIL) 1000 MG CAPS Take 1,000 mg by mouth daily.     ? Calcium Carb-Cholecalciferol (CALCIUM 1000 + D PO) Take 1,000 mg by mouth daily.     ?  Multiple Vitamins-Minerals (HAIR SKIN AND NAILS FORMULA PO) Take 1 tablet by mouth daily.     ? ?No facility-administered medications prior to visit.  ? ? ?Review of Systems  ?Constitutional:  Negative for chills, fever, malaise/fatigue and weight loss.  ?HENT:  Negative for hearing loss, sore throat and tinnitus.   ?Eyes:  Negative for blurred vision and double vision.  ?Respiratory:  Negative for cough, hemoptysis, sputum production, shortness of breath, wheezing and stridor.   ?Cardiovascular:  Negative for chest pain, palpitations, orthopnea, leg swelling and PND.  ?Gastrointestinal:  Negative for abdominal pain, constipation, diarrhea, heartburn, nausea and vomiting.  ?Genitourinary:  Negative for dysuria, hematuria and urgency.  ?Musculoskeletal:  Negative for joint pain and myalgias.  ?Skin:  Negative for itching and rash.  ?Neurological:  Negative for dizziness, tingling, weakness and headaches.  ?Endo/Heme/Allergies:  Negative for environmental allergies. Does not bruise/bleed easily.  ?Psychiatric/Behavioral:  Negative for depression. The patient is not nervous/anxious and does not have insomnia.   ?All other systems reviewed and are negative. ? ? ?Objective:  ?Physical Exam ?Vitals reviewed.  ?Constitutional:   ?   General: She is not in acute distress. ?   Appearance: She is well-developed. She is obese.  ?HENT:  ?   Head: Normocephalic and atraumatic.   ?Eyes:  ?   General: No scleral icterus. ?   Conjunctiva/sclera: Conjunctivae normal.  ?   Pupils: Pupils are equal, round, and reactive to light.  ?Neck:  ?   Vascular: No JVD.  ?   Trachea: No tracheal

## 2022-09-21 DIAGNOSIS — Z1231 Encounter for screening mammogram for malignant neoplasm of breast: Secondary | ICD-10-CM | POA: Diagnosis not present

## 2022-09-21 DIAGNOSIS — Z85038 Personal history of other malignant neoplasm of large intestine: Secondary | ICD-10-CM | POA: Diagnosis not present

## 2022-09-21 DIAGNOSIS — Z3040 Encounter for surveillance of contraceptives, unspecified: Secondary | ICD-10-CM | POA: Diagnosis not present

## 2022-09-21 DIAGNOSIS — Z6834 Body mass index (BMI) 34.0-34.9, adult: Secondary | ICD-10-CM | POA: Diagnosis not present

## 2022-09-21 DIAGNOSIS — Z01419 Encounter for gynecological examination (general) (routine) without abnormal findings: Secondary | ICD-10-CM | POA: Diagnosis not present

## 2022-10-12 ENCOUNTER — Telehealth: Payer: Self-pay

## 2022-10-12 NOTE — Patient Outreach (Signed)
  Care Coordination   10/12/2022 Name: Amanda Bernard MRN: [id number] DOB: 07-26-1971   Care Coordination Outreach Attempts:  An unsuccessful telephone outreach was attempted today to offer the patient information about available care coordination services as a benefit of their health plan.   Follow Up Plan:  Additional outreach attempts will be made to offer the patient care coordination information and services.   Encounter Outcome:  No Answer  Care Coordination Interventions Activated:  No   Care Coordination Interventions:  No, not indicated    Tomasa Rand, RN, BSN, CEN [facility] [employer] [phone]

## 2022-10-15 ENCOUNTER — Telehealth: Payer: Self-pay

## 2022-10-15 NOTE — Patient Outreach (Signed)
  Care Coordination   10/15/2022 Name: Amanda Bernard MRN: [id number] DOB: [birth year]   Care Coordination Outreach Attempts:  A second unsuccessful outreach was attempted today to offer the patient with information about available care coordination services as a benefit of their health plan.     Follow Up Plan:  Additional outreach attempts will be made to offer the patient care coordination information and services.   Encounter Outcome:  No Answer  Care Coordination Interventions Activated:  No   Care Coordination Interventions:  No, not indicated   Tomasa Rand, RN, BSN, CEN [facility] [employer] [phone]

## 2022-10-16 ENCOUNTER — Telehealth: Payer: Self-pay

## 2022-10-16 NOTE — Patient Outreach (Signed)
  Care Coordination   Initial Visit Note   10/16/2022 Name: Amanda Bernard MRN: [id number] DOB: 12-14-1970  Amanda Bernard is a 51 y.o. year old female who sees Patient, No Pcp Per for primary care. I spoke with  Pierce Crane by phone today.  What matters to the patients health and wellness today?  Placed call to patient. Reviewed PCP and patient does not have a PCP.      Goals Addressed             This Visit's Progress    COMPLETED: " I do not have a PCP"       Care Coordination Interventions: Encouraged patient to select a PCP.  Provided patient with the physical referral line at [phone]         SDOH assessments and interventions completed:  No     Care Coordination Interventions Activated:  No  Care Coordination Interventions:  No, not indicated   Follow up plan: No further intervention required.   Encounter Outcome:  Pt. Visit Completed   Tomasa Rand, RN, BSN, CEN [place] Coord[facility] [phone]

## 2023-02-18 DIAGNOSIS — Z08 Encounter for follow-up examination after completed treatment for malignant neoplasm: Secondary | ICD-10-CM | POA: Diagnosis not present

## 2023-02-18 DIAGNOSIS — Z85038 Personal history of other malignant neoplasm of large intestine: Secondary | ICD-10-CM | POA: Diagnosis not present

## 2023-03-02 ENCOUNTER — Ambulatory Visit
Admission: RE | Admit: 2023-03-02 | Discharge: 2023-03-02 | Disposition: A | Discharge status: Home / Self Care | Payer: 59 | Source: Ambulatory Visit | Attending: Pulmonary Disease | Admitting: Pulmonary Disease

## 2023-03-02 DIAGNOSIS — R911 Solitary pulmonary nodule: Secondary | ICD-10-CM

## 2023-03-02 DIAGNOSIS — C189 Malignant neoplasm of colon, unspecified: Secondary | ICD-10-CM | POA: Diagnosis not present

## 2023-03-12 ENCOUNTER — Encounter: Payer: Self-pay | Admitting: Pulmonary Disease

## 2023-03-12 ENCOUNTER — Ambulatory Visit: Payer: 59 | Admitting: Pulmonary Disease

## 2023-03-12 VITALS — BP 120/82 | HR 66 | Ht 65.0 in | Wt 211.0 lb

## 2023-03-12 DIAGNOSIS — C187 Malignant neoplasm of sigmoid colon: Secondary | ICD-10-CM

## 2023-03-12 DIAGNOSIS — R911 Solitary pulmonary nodule: Secondary | ICD-10-CM | POA: Diagnosis not present

## 2023-03-12 DIAGNOSIS — Z9889 Other specified postprocedural states: Secondary | ICD-10-CM | POA: Diagnosis not present

## 2023-03-12 DIAGNOSIS — Z87891 Personal history of nicotine dependence: Secondary | ICD-10-CM

## 2023-03-12 DIAGNOSIS — R918 Other nonspecific abnormal finding of lung field: Secondary | ICD-10-CM

## 2023-03-12 NOTE — Patient Instructions (Signed)
Thank you for visiting Dr. Tonia Brooms at [facility]. Today we recommend the following: Orders Placed This Encounter  Procedures   Ambulatory Referral for Lung Cancer Scre    Return if symptoms worsen or fail to improve, for follow up with annual LDCT program .    Please do your part to reduce the spread of COVID-19.

## 2023-03-12 NOTE — Progress Notes (Signed)
Shared Decision Making Visit Lung Cancer Screening Program [phone])   Eligibility: Age 52 y.o. Pack Years Smoking History Calculation 35 pack years  (# packs/per year x # years smoked) Recent History of coughing up blood  no Unexplained weight loss? no ( >Than 15 pounds within the last 6 months ) Prior History Lung / other cancer no (Diagnosis within the last 5 years already requiring surveillance chest CT Scans). Smoking Status Former Smoker Former Smokers: Years since quit: 12 years  Quit Date: Dec 09, 2010  Visit Components: Discussion included one or more decision making aids. yes Discussion included risk/benefits of screening. yes Discussion included potential follow up diagnostic testing for abnormal scans. yes Discussion included meaning and risk of over diagnosis. yes Discussion included meaning and risk of False Positives. yes Discussion included meaning of total radiation exposure. yes  Counseling Included: Importance of adherence to annual lung cancer LDCT screening. yes Impact of comorbidities on ability to participate in the program. yes Ability and willingness to under diagnostic treatment. yes   Josephine Igo, DO [facility] 03/12/2023 4:40 PM

## 2023-03-12 NOTE — Progress Notes (Signed)
a  Synopsis: Referred in May 2021 for pulmonary nodule by No ref. provider found  Subjective:   PATIENT ID: Amanda Bernard GENDER: female DOB: [birth year], MRN: [id number]  Chief Complaint  Patient presents with   Follow-up    F/up CT    This is a 52 year old female, past medical history of sigmoid colon cancer and newly diagnosed right lung nodule.  Patient underwent biopsies and February 2021 which revealed an invasive adenocarcinoma.  Patient's images were reviewed at medical thoracic oncology conference and decision was made for biopsy of the lower lobe lung nodule.  It did have low-level SUV uptake on PET imaging.  Patient was referred to pulmonary for navigational bronchoscopy evaluation.  OV 04/17/2020: Patient with no respiratory complaints today.  Little anxious about everything that has been going on recently.  Is aware that she was referred here for bronchoscopy and biopsy.  We discussed this today in the office with detail.  Patient is a non-smoker.  OV 09/19/2020: Patient here today for follow-up regarding lung nodules.  Since last office visit with me patient underwent navigational bronchoscopy in May 2021.  The bronchoscopy biopsies had negative cytology.  She was previously diagnosed with colon cancer.  Bronchoscopy was done in an effort to potentially rule out metastatic disease.  All pathology specimens from navigational bronchoscopy were negative for malignancy she had a repeat noncontrasted CT of the chest on 08/19/2020.  This revealed stability of the 1.6 x 1.6 x 1 cm right lower lobe pulmonary nodule.  Patient is seen today.  Discussed all of her recent findings on CT imaging.  Also reviewed again biopsy results.  And plan potential plans for follow-up.  OV 05/12/2021: Patient here today for follow-up regarding lower lobe lung nodule.  Prior bronchoscopy in May 2021 was negative for cytology.  Did have a previous colon cancer that was resected.  All pathology from the specimen  was negative for malignancy.  Fiducial was placed at that time.  CT imaging was complete for follow-up of this nodule in April 2022.  Lung nodule was stable with no change in size still measuring approximately 1.5 cm in largest cross-section.  Patient has no respiratory complaints at this time.  Since last office visit she did have a surgical consultation with Dr. Cliffton AstersLightfoot in October 2021.  They opted for routine surveillance of the nodule if there is any change would consider repeat biopsy or considerations for surgery in the future.  A wedge resection would at least give us a definitive diagnosis.  OV 03/02/2022: Here today for follow-up after recent CT scan of the chest.Patient has had a lung nodule now followed for approximately 2 years recent CT scan of the chest in March 2023 reveals documented stability of this nodule still in the right lower lobe on the apical portion of the diaphragm within the adjacent fiducial marker.  She also saw Dr. Cliffton AstersLightfoot in consultation in the past for consideration for resection.  We opted for repeat biopsy before undergoing any consideration for surgery.  OV 03/12/2023: Here today for follow-up after recent CT chest.  Nodule is stable.  Has been stable now since 2021.  She is a former smoker with a 35-pack-year history quit 12 years ago so she does qualify for lung cancer screening.  She does have a history of malignancy but does not require annual CT chest surveillance so she does qualify.    Past Medical History:  Diagnosis Date   Anxiety    Colon cancer  GERD (gastroesophageal reflux disease)    Lung nodule    Obesity      Family History  Problem Relation Age of Onset   Cancer Paternal Uncle        brain cancer    Cancer Maternal Grandmother 5       ovarian cancer    Cancer Paternal Grandfather        lung cancer   Cancer Child 1       testicular cancer      Past Surgical History:  Procedure Laterality Date   BRONCHIAL BIOPSY  04/23/2020    Procedure: BRONCHIAL BIOPSIES;  Surgeon: Josephine Igo, DO;  Location: [facility];  Service: Pulmonary;;   BRONCHIAL BRUSHINGS  04/23/2020   Procedure: BRONCHIAL BRUSHINGS;  Surgeon: Josephine Igo, DO;  Location: [facility];  Service: Pulmonary;;   BRONCHIAL NEEDLE ASPIRATION BIOPSY  04/23/2020   Procedure: BRONCHIAL NEEDLE ASPIRATION BIOPSIES;  Surgeon: Josephine Igo, DO;  Location: [facility];  Service: Pulmonary;;   BRONCHIAL WASHINGS  04/23/2020   Procedure: BRONCHIAL WASHINGS;  Surgeon: Josephine Igo, DO;  Location: [facility];  Service: Pulmonary;;   CESAREAN SECTION     x 2   COLONOSCOPY     FIDUCIAL MARKER PLACEMENT  04/23/2020   Procedure: FIDUCIAL MARKER PLACEMENT;  Surgeon: Josephine Igo, DO;  Location: [facility];  Service: Pulmonary;;   TUBAL LIGATION     VIDEO BRONCHOSCOPY WITH ENDOBRONCHIAL NAVIGATION N/A 04/23/2020   Procedure: VIDEO BRONCHOSCOPY WITH ENDOBRONCHIAL NAVIGATION;  Surgeon: Josephine Igo, DO;  Location: [facility];  Service: Pulmonary;  Laterality: N/A;   WISDOM TOOTH EXTRACTION      Social History   Socioeconomic History   Marital status: Media planner    Spouse name: Not on file   Number of children: 3   Years of education: Not on file   Highest education level: Not on file  Occupational History   Not on file  Tobacco Use   Smoking status: Former    Packs/day: 1.00    Years: 25.00    Additional pack years: 0.00    Total pack years: 25.00    Types: Cigarettes    Quit date: 12/07/2009    Years since quitting: 13.2   Smokeless tobacco: Never  Vaping Use   Vaping Use: Never used  Substance and Sexual Activity   Alcohol use: Yes    Alcohol/week: 20.0 standard drinks of alcohol    Types: 20 Cans of beer per week    Comment: for 3 years    Drug use: Not Currently   Sexual activity: Yes  Other Topics Concern   Not on file  Social History Narrative   Not on file   Social Determinants of Health   Financial Resource  Strain: Not on file  Food Insecurity: Not on file  Transportation Needs: Not on file  Physical Activity: Not on file  Stress: Not on file  Social Connections: Not on file  Intimate Partner Violence: Not on file     No Known Allergies   Outpatient Medications Prior to Visit  Medication Sig Dispense Refill   B Complex Vitamins (VITAMIN B COMPLEX 100 IJ) Take 1 tablet by mouth daily.      Cholecalciferol (VITAMIN D3 PO) Take 1 tablet by mouth daily.     COLLAGEN PO Take 1 tablet by mouth daily. Peptides     minoxidil (LONITEN) 2.5 MG tablet Take 2.5 mg by mouth daily.     Multiple  Vitamins-Minerals (MULTIVITAMIN ADULT EXTRA C PO) Take 1 tablet by mouth daily.      famotidine (PEPCID) 20 MG tablet Take 20 mg by mouth daily.     loratadine (CLARITIN) 10 MG tablet Take 10 mg by mouth daily.     Omega-3 Fatty Acids (FISH OIL) 1000 MG CAPS Take 1,000 mg by mouth daily.      No facility-administered medications prior to visit.    Review of Systems  Constitutional:  Negative for chills, fever, malaise/fatigue and weight loss.  HENT:  Negative for hearing loss, sore throat and tinnitus.   Eyes:  Negative for blurred vision and double vision.  Respiratory:  Negative for cough, hemoptysis, sputum production, shortness of breath, wheezing and stridor.   Cardiovascular:  Negative for chest pain, palpitations, orthopnea, leg swelling and PND.  Gastrointestinal:  Negative for abdominal pain, constipation, diarrhea, heartburn, nausea and vomiting.  Genitourinary:  Negative for dysuria, hematuria and urgency.  Musculoskeletal:  Negative for joint pain and myalgias.  Skin:  Negative for itching and rash.  Neurological:  Negative for dizziness, tingling, weakness and headaches.  Endo/Heme/Allergies:  Negative for environmental allergies. Does not bruise/bleed easily.  Psychiatric/Behavioral:  Negative for depression. The patient is not nervous/anxious and does not have insomnia.   All other systems  reviewed and are negative.    Objective:  Physical Exam Vitals reviewed.  Constitutional:      General: She is not in acute distress.    Appearance: She is well-developed. She is obese.  HENT:     Head: Normocephalic and atraumatic.  Eyes:     General: No scleral icterus.    Conjunctiva/sclera: Conjunctivae normal.     Pupils: Pupils are equal, round, and reactive to light.  Neck:     Vascular: No JVD.     Trachea: No tracheal deviation.  Cardiovascular:     Rate and Rhythm: Normal rate and regular rhythm.     Heart sounds: Normal heart sounds. No murmur heard. Pulmonary:     Effort: Pulmonary effort is normal. No tachypnea, accessory muscle usage or respiratory distress.     Breath sounds: No stridor. No wheezing, rhonchi or rales.  Abdominal:     General: Bowel sounds are normal. There is no distension.     Palpations: Abdomen is soft.     Tenderness: There is no abdominal tenderness.  Musculoskeletal:        General: No tenderness.     Cervical back: Neck supple.  Lymphadenopathy:     Cervical: No cervical adenopathy.  Skin:    General: Skin is warm and dry.     Capillary Refill: Capillary refill takes less than 2 seconds.     Findings: No rash.  Neurological:     Mental Status: She is alert and oriented to person, place, and time.  Psychiatric:        Behavior: Behavior normal.      Vitals:   03/12/23 1552  BP: 120/82  Pulse: 66  SpO2: 99%  Weight: 211 lb (95.7 kg)  Height: 5\' 5"  (1.651 m)   99% on RA BMI Readings from Last 3 Encounters:  03/12/23 35.11 kg/m  03/02/22 34.55 kg/m  05/12/21 34.78 kg/m   Wt Readings from Last 3 Encounters:  03/12/23 211 lb (95.7 kg)  03/02/22 207 lb 9.6 oz (94.2 kg)  05/12/21 209 lb (94.8 kg)     CBC    Component Value Date/Time   WBC 6.2 04/19/2020 1038   RBC 4.76 04/19/2020  1038   HGB 14.5 04/19/2020 1038   HCT 43.7 04/19/2020 1038   PLT 276.0 04/19/2020 1038   MCV 91.9 04/19/2020 1038   MCHC 33.2  04/19/2020 1038   RDW 14.2 04/19/2020 1038   LYMPHSABS 2.2 04/19/2020 1038   MONOABS 0.4 04/19/2020 1038   EOSABS 0.3 04/19/2020 1038   BASOSABS 0.1 04/19/2020 1038    Chest Imaging: Medicine pet imaging April 2021: Low-level PET uptake between patient's right lower lobe pulmonary nodule SUV 3.0 possible underlying malignancy. The patient's images have been independently reviewed by me.    CT chest April 2022: Stable 1.5 cm lower lobe pulmonary nodule. The patient's images have been independently reviewed by me.    Pulmonary Functions Testing Results:     No data to display          FeNO: none  Pathology: none  Echocardiogram: none  Heart Catheterization: none    Assessment & Plan:     ICD-10-CM   1. Former smoker [Z87.891]  Z87.891 Ambulatory Referral for Lung Cancer Scre    2. Nodule of lower lobe of right lung  R91.1     3. Cancer of sigmoid colon  C18.7     4. S/P bronchoscopy with biopsy  Z98.890     5. Abnormal findings on diagnostic imaging of lung  R91.8       Discussion:  This is a 52 year old female, prior diagnosis of sigmoid colon cancer under surveillance for stage I disease.  She has a right lower lobe pulmonary nodule underwent bronchoscopy which was nondiagnostic for malignancy.  She has had subsequent CT follow-ups that have demonstrated that the lesion has been stable now through March 2024.  Plan: I think she needs no additional CT follow-up for her 6 images. This document 3-year stability. We will enroll her in our lung cancer screening program to start in March 2024 I haveThis referral and completed the shared decision making visit please see separate documentation.    Current Outpatient Medications:    B Complex Vitamins (VITAMIN B COMPLEX 100 IJ), Take 1 tablet by mouth daily. , Disp: , Rfl:    Cholecalciferol (VITAMIN D3 PO), Take 1 tablet by mouth daily., Disp: , Rfl:    COLLAGEN PO, Take 1 tablet by mouth daily. Peptides, Disp: ,  Rfl:    minoxidil (LONITEN) 2.5 MG tablet, Take 2.5 mg by mouth daily., Disp: , Rfl:    Multiple Vitamins-Minerals (MULTIVITAMIN ADULT EXTRA C PO), Take 1 tablet by mouth daily. , Disp: , Rfl:    Josephine Igo, DO [facility] 03/12/2023 4:42 PM

## 2023-03-15 ENCOUNTER — Other Ambulatory Visit: Payer: Self-pay | Admitting: *Deleted

## 2023-03-15 DIAGNOSIS — Z87891 Personal history of nicotine dependence: Secondary | ICD-10-CM

## 2023-03-15 DIAGNOSIS — Z122 Encounter for screening for malignant neoplasm of respiratory organs: Secondary | ICD-10-CM

## 2023-03-18 ENCOUNTER — Emergency Department (HOSPITAL_COMMUNITY): Payer: 59

## 2023-03-18 ENCOUNTER — Encounter (HOSPITAL_COMMUNITY): Payer: Self-pay

## 2023-03-18 ENCOUNTER — Emergency Department (HOSPITAL_COMMUNITY)
Admission: EM | Admit: 2023-03-18 | Discharge: 2023-03-19 | Disposition: A | Discharge status: Home / Self Care | Payer: 59 | Attending: Emergency Medicine | Admitting: Emergency Medicine

## 2023-03-18 DIAGNOSIS — Z85038 Personal history of other malignant neoplasm of large intestine: Secondary | ICD-10-CM | POA: Insufficient documentation

## 2023-03-18 DIAGNOSIS — R0789 Other chest pain: Secondary | ICD-10-CM | POA: Insufficient documentation

## 2023-03-18 DIAGNOSIS — R079 Chest pain, unspecified: Secondary | ICD-10-CM | POA: Diagnosis not present

## 2023-03-18 LAB — CBC
HCT: 43.3 % (ref 36.0–46.0)
Hemoglobin: 14.3 g/dL (ref 12.0–15.0)
MCH: 31.2 pg (ref 26.0–34.0)
MCHC: 33 g/dL (ref 30.0–36.0)
MCV: 94.5 fL (ref 80.0–100.0)
Platelets: 274 10*3/uL (ref 150–400)
RBC: 4.58 MIL/uL (ref 3.87–5.11)
RDW: 13 % (ref 11.5–15.5)
WBC: 8.4 10*3/uL (ref 4.0–10.5)
nRBC: 0 % (ref 0.0–0.2)

## 2023-03-18 NOTE — ED Triage Notes (Signed)
Pt arrived POV for left sided chest pain for a couple of days, pt reports it started with left shoulder pain, unsure if she injured shoulder at work. Pt reports CP has been intermittent and woke her from sleep. Pt denies SOB, n/v, but does feel light headed. VSS, NAD noted, A&O x4.

## 2023-03-19 DIAGNOSIS — R079 Chest pain, unspecified: Secondary | ICD-10-CM | POA: Diagnosis not present

## 2023-03-19 DIAGNOSIS — R0789 Other chest pain: Secondary | ICD-10-CM | POA: Diagnosis not present

## 2023-03-19 LAB — BASIC METABOLIC PANEL
Anion gap: 9 (ref 5–15)
BUN: 11 mg/dL (ref 6–20)
CO2: 27 mmol/L (ref 22–32)
Calcium: 9.5 mg/dL (ref 8.9–10.3)
Chloride: 104 mmol/L (ref 98–111)
Creatinine, Ser: 0.72 mg/dL (ref 0.44–1.00)
GFR, Estimated: >60 mL/min (ref >60)
Glucose, Bld: 109 mg/dL — ABNORMAL HIGH (ref 70–99)
Potassium: 3.5 mmol/L (ref 3.5–5.1)
Sodium: 140 mmol/L (ref 135–145)

## 2023-03-19 LAB — TROPONIN I (HIGH SENSITIVITY)
Troponin I (High Sensitivity): 4 ng/L (ref <18)
Troponin I (High Sensitivity): 5 ng/L (ref <18)

## 2023-03-19 LAB — D-DIMER, QUANTITATIVE: D-Dimer, Quant: <0.27 ug/mL-FEU (ref 0.00–0.50)

## 2023-03-19 MED ORDER — NAPROXEN 500 MG PO TABS: 500.0000 mg | ORAL_TABLET | Freq: Two times a day (BID) | ORAL | 0 refills | Status: DC

## 2023-03-19 MED ORDER — OXYCODONE-ACETAMINOPHEN 5-325 MG PO TABS
1.0000 | ORAL_TABLET | Freq: Once | ORAL | Status: AC
  Administered 2023-03-19: 1 via ORAL
  Filled 2023-03-19: qty 1

## 2023-03-19 MED ORDER — KETOROLAC TROMETHAMINE 15 MG/ML IJ SOLN
15.0000 mg | Freq: Once | INTRAMUSCULAR | Status: AC
  Administered 2023-03-19: 15 mg via INTRAVENOUS
  Filled 2023-03-19: qty 1

## 2023-03-19 NOTE — ED Provider Notes (Signed)
[place] [facility]GENC[facility] DEPARTMENT AT [facility] Provider Note   CSN: [id number] Arrival date & time: 03/18/23  2334     History  Chief Complaint  Patient presents with   Chest Pain    Amanda Bernard is a 52 y.o. female.  HPI     This is a 73 old female with no reported past medical history who presents with chest discomfort.  Patient reports intermittent chest pain over the last several days.  She reports that it is sharp and mostly under her left breast.  It woke her up from sleep tonight.  Denies any worsening of pain with exertion or deep breathing.  No recent fevers, cough, shortness of breath.  She has taken some ibuprofen with some potential relief.  He did do some yard work last weekend and wonders whether she may have pulled something.  Home Medications Prior to Admission medications   Medication Sig Start Date End Date Taking? Authorizing Provider  B Complex Vitamins (VITAMIN B COMPLEX 100 IJ) Take 1 tablet by mouth daily.    Yes [provider]  cetirizine (ZYRTEC) 10 MG tablet Take 10 mg by mouth daily.   Yes [provider]  Cholecalciferol (VITAMIN D3 PO) Take 1 tablet by mouth daily.   Yes [provider]  COLLAGEN PO Take 1 tablet by mouth daily. Peptides   Yes [provider]  [employer] Fiber Gummy Bears Children CHEW Chew 2 tablets by mouth daily.   Yes [provider]  minoxidil (LONITEN) 2.5 MG tablet Take 2.5 mg by mouth daily.   Yes [provider]  Multiple Vitamins-Minerals (HAIR SKIN & NAILS PO) Take 1 capsule by mouth daily.   Yes [provider]  Multiple Vitamins-Minerals (MULTIVITAMIN ADULT EXTRA C PO) Take 1 tablet by mouth daily.    Yes [provider]  naproxen (NAPROSYN) 500 MG tablet Take 1 tablet (500 mg total) by mouth 2 (two) times daily. 03/19/23  Yes Rocquel Askren, Mayer Masker, MD  Pumpkin Seed 500 MG CAPS Take 2 capsules by mouth daily.   Yes [provider]       Allergies    Patient has no known allergies.    Review of Systems   Review of Systems  Constitutional:  Negative for fever.  Respiratory:  Negative for cough and shortness of breath.   Cardiovascular:  Positive for chest pain.  All other systems reviewed and are negative.   Physical Exam Updated Vital Signs BP (!) 153/88 (BP Location: Right Arm)   Pulse 77   Temp 97.8 F (36.6 C) (Oral)   Resp 19   Ht 1.651 m (5\' 5" )   Wt 95.7 kg   SpO2 100%   BMI 35.11 kg/m  Physical Exam Vitals and nursing note reviewed.  Constitutional:      Appearance: She is well-developed. She is obese. She is not ill-appearing.  HENT:     Head: Normocephalic and atraumatic.  Eyes:     Pupils: Pupils are equal, round, and reactive to light.  Cardiovascular:     Rate and Rhythm: Normal rate and regular rhythm.     Heart sounds: Normal heart sounds.  Pulmonary:     Effort: Pulmonary effort is normal. No respiratory distress.     Breath sounds: No wheezing.  Chest:     Chest wall: No tenderness.  Abdominal:     General: Bowel sounds are normal.     Palpations: Abdomen is soft.  Musculoskeletal:  Cervical back: Neck supple.     Right lower leg: No tenderness. No edema.     Left lower leg: No tenderness. No edema.  Skin:    General: Skin is warm and dry.  Neurological:     Mental Status: She is alert and oriented to person, place, and time.  Psychiatric:        Mood and Affect: Mood normal.     ED Results / Procedures / Treatments   Labs (all labs ordered are listed, but only abnormal results are displayed) Labs Reviewed  BASIC METABOLIC PANEL - Abnormal; Notable for the following components:      Result Value   Glucose, Bld 109 (*)    All other components within normal limits  CBC  D-DIMER, QUANTITATIVE  TROPONIN I (HIGH SENSITIVITY)  TROPONIN I (HIGH SENSITIVITY)    EKG EKG Interpretation  Date/Time:  Friday March 19 2023 00:04:58 EDT Ventricular Rate:  65 PR  Interval:  147 QRS Duration: 94 QT Interval:  415 QTC Calculation: 432 R Axis:   73 Text Interpretation: Sinus rhythm Baseline wander in lead(s) V6 Confirmed by Ross Marcus ([id number]) on 03/19/2023 12:47:04 AM  Radiology DG Chest 2 View  Result Date: 03/19/2023 CLINICAL DATA:  Chest pain EXAM: CHEST - 2 VIEW COMPARISON:  CT 03/02/2023 FINDINGS: The heart size and mediastinal contours are within normal limits. Both lungs are clear. Small fiducial marker at the right base. The visualized skeletal structures are unremarkable. Metallic clips over the left chest. IMPRESSION: No active cardiopulmonary disease. Electronically Signed   By: Jasmine Pang M.D.   On: 03/19/2023 00:11    Procedures Procedures    Medications Ordered in ED Medications  ketorolac (TORADOL) 15 MG/ML injection 15 mg (15 mg Intravenous Given 03/19/23 0025)  oxyCODONE-acetaminophen (PERCOCET/ROXICET) 5-325 MG per tablet 1 tablet (1 tablet Oral Given 03/19/23 0131)    ED Course/ Medical Decision Making/ A&P                             Medical Decision Making Amount and/or Complexity of Data Reviewed Labs: ordered. Radiology: ordered.  Risk Prescription drug management.   This patient presents to the ED for concern of chest pain, this involves an extensive number of treatment options, and is a complaint that carries with it a high risk of complications and morbidity.  I considered the following differential and admission for this acute, potentially life threatening condition.  The differential diagnosis includes ACS, PE, pneumothorax, pneumonia, pleurisy, chest wall pain  MDM:    This is a 52 year old female who presents with chest pain.  She is nontoxic.  Vital signs notable for blood pressure 153/88.  Fairly low risk for ACS.  She is hypertensive here and has a history of smoking.  EKG is normal.  Troponin x 2 negative.  Chest x-ray shows no evidence of pneumothorax or pneumonia.  D-dimer was sent and is negative.   This effectively rules out PE.  Discussed with patient that her pain may be pleurisy or chest wall discomfort.  Will treat conservatively with anti-inflammatories scheduled over the next 3 to 5 days.  (Labs, imaging, consults)  Labs: I Ordered, and personally interpreted labs.  The pertinent results include: CBC, BMP, troponin x 2, D-dimer  Imaging Studies ordered: I ordered imaging studies including chest x-ray I independently visualized and interpreted imaging. I agree with the radiologist interpretation  Additional history obtained from chart review.  External records from  outside source obtained and reviewed including prior evaluations  Cardiac Monitoring: The patient was maintained on a cardiac monitor.  If on the cardiac monitor, I personally viewed and interpreted the cardiac monitored which showed an underlying rhythm of: Sinus rhythm  Reevaluation: After the interventions noted above, I reevaluated the patient and found that they have :improved  Social Determinants of Health:  lives independently  Disposition: Discharge  Co morbidities that complicate the patient evaluation  Past Medical History:  Diagnosis Date   Anxiety    Colon cancer    GERD (gastroesophageal reflux disease)    Lung nodule    Obesity      Medicines Meds ordered this encounter  Medications   ketorolac (TORADOL) 15 MG/ML injection 15 mg   oxyCODONE-acetaminophen (PERCOCET/ROXICET) 5-325 MG per tablet 1 tablet   naproxen (NAPROSYN) 500 MG tablet    Sig: Take 1 tablet (500 mg total) by mouth 2 (two) times daily.    Dispense:  30 tablet    Refill:  0    I have reviewed the patients home medicines and have made adjustments as needed  Problem List / ED Course: Problem List Items Addressed This Visit   None Visit Diagnoses     Atypical chest pain    -  Primary                   Final Clinical Impression(s) / ED Diagnoses Final diagnoses:  Atypical chest pain    Rx / DC  Orders ED Discharge Orders          Ordered    naproxen (NAPROSYN) 500 MG tablet  2 times daily        03/19/23 0259              Shon Baton, MD 03/19/23 0302

## 2023-03-19 NOTE — Discharge Instructions (Signed)
You were seen today for chest pain.  Your workup today is reassuring.  Take naproxen twice daily for the next 3 to 5 days to see if this helps.  If not improving, follow-up with your primary physician.

## 2023-03-19 NOTE — ED Notes (Signed)
Sent urine sample to the lab.

## 2023-03-24 ENCOUNTER — Ambulatory Visit (INDEPENDENT_AMBULATORY_CARE_PROVIDER_SITE_OTHER): Payer: 59 | Admitting: Internal Medicine

## 2023-03-24 ENCOUNTER — Telehealth: Payer: Self-pay

## 2023-03-24 ENCOUNTER — Encounter: Payer: Self-pay | Admitting: Internal Medicine

## 2023-03-24 VITALS — BP 170/100 | HR 74 | Temp 98.4°F | Ht 65.0 in | Wt 210.0 lb

## 2023-03-24 DIAGNOSIS — R030 Elevated blood-pressure reading, without diagnosis of hypertension: Secondary | ICD-10-CM

## 2023-03-24 DIAGNOSIS — Z23 Encounter for immunization: Secondary | ICD-10-CM

## 2023-03-24 DIAGNOSIS — Z0001 Encounter for general adult medical examination with abnormal findings: Secondary | ICD-10-CM

## 2023-03-24 DIAGNOSIS — Z0000 Encounter for general adult medical examination without abnormal findings: Secondary | ICD-10-CM

## 2023-03-24 DIAGNOSIS — Z1159 Encounter for screening for other viral diseases: Secondary | ICD-10-CM | POA: Diagnosis not present

## 2023-03-24 DIAGNOSIS — R7301 Impaired fasting glucose: Secondary | ICD-10-CM | POA: Diagnosis not present

## 2023-03-24 LAB — LIPID PANEL
Cholesterol: 173 mg/dL (ref 0–200)
HDL: 53.5 mg/dL (ref >39.00)
LDL Cholesterol: 92 mg/dL (ref 0–99)
NonHDL: 119.49
Total CHOL/HDL Ratio: 3
Triglycerides: 135 mg/dL (ref 0.0–149.0)
VLDL: 27 mg/dL (ref 0.0–40.0)

## 2023-03-24 LAB — HEMOGLOBIN A1C: Hgb A1c MFr Bld: 5.4 % (ref 4.6–6.5)

## 2023-03-24 NOTE — Patient Instructions (Addendum)
We will have you check the blood pressure at home 1-2 times a month. The goal is <140/90.

## 2023-03-24 NOTE — Telephone Encounter (Signed)
Message patient through mychart

## 2023-03-24 NOTE — Progress Notes (Unsigned)
   Subjective:   Patient ID: Amanda Bernard, female    DOB: Mar 04, 1971, 52 y.o.   MRN: [id number]  HPI The patient is a new 52 YO female coming in for ongoing medical care see A/P.   Review of Systems  Constitutional: Negative.   HENT: Negative.    Eyes: Negative.   Respiratory:  Negative for cough, chest tightness and shortness of breath.   Cardiovascular:  Negative for chest pain, palpitations and leg swelling.  Gastrointestinal:  Negative for abdominal distention, abdominal pain, constipation, diarrhea, nausea and vomiting.  Musculoskeletal: Negative.   Skin: Negative.   Neurological: Negative.   Psychiatric/Behavioral: Negative.      Objective:  Physical Exam Constitutional:      Appearance: She is well-developed.  HENT:     Head: Normocephalic and atraumatic.  Cardiovascular:     Rate and Rhythm: Normal rate and regular rhythm.  Pulmonary:     Effort: Pulmonary effort is normal. No respiratory distress.     Breath sounds: Normal breath sounds. No wheezing or rales.  Abdominal:     General: Bowel sounds are normal. There is no distension.     Palpations: Abdomen is soft.     Tenderness: There is no abdominal tenderness. There is no rebound.  Musculoskeletal:     Cervical back: Normal range of motion.  Skin:    General: Skin is warm and dry.  Neurological:     Mental Status: She is alert and oriented to person, place, and time.     Coordination: Coordination normal.     Vitals:   03/24/23 0807 03/24/23 0809  BP: (!) 160/140 (!) 160/140  Pulse: 74   Temp: 98.4 F (36.9 C)   TempSrc: Oral   SpO2: 98%   Weight: 210 lb (95.3 kg)   Height: 5\' 5"  (1.651 m)     Assessment & Plan:  Tdap given at visit

## 2023-03-25 DIAGNOSIS — Z0000 Encounter for general adult medical examination without abnormal findings: Secondary | ICD-10-CM | POA: Insufficient documentation

## 2023-03-25 DIAGNOSIS — R030 Elevated blood-pressure reading, without diagnosis of hypertension: Secondary | ICD-10-CM | POA: Insufficient documentation

## 2023-03-25 DIAGNOSIS — R7301 Impaired fasting glucose: Secondary | ICD-10-CM | POA: Insufficient documentation

## 2023-03-25 LAB — HEPATITIS C ANTIBODY: Hepatitis C Ab: NONREACTIVE

## 2023-03-25 NOTE — Assessment & Plan Note (Signed)
Some white coat hypertension unclear if truly high. Asked her to check readings 1-2 times per week for several weeks and inform us of readings.

## 2023-03-25 NOTE — Assessment & Plan Note (Signed)
Checking HgA1c for some elevated sugars on labs to assess for pre-diabetes.

## 2023-03-25 NOTE — Assessment & Plan Note (Signed)
Flu shot yearly. Shingrix complete per patient. Tetanus given. Colonoscopy up to date. Mammogram up to date, pap smear up to date. Counseled about sun safety and mole surveillance. Counseled about the dangers of distracted driving. Given 10 year screening recommendations.

## 2023-04-13 DIAGNOSIS — N950 Postmenopausal bleeding: Secondary | ICD-10-CM | POA: Diagnosis not present

## 2023-04-15 DIAGNOSIS — N950 Postmenopausal bleeding: Secondary | ICD-10-CM | POA: Diagnosis not present

## 2023-07-01 DIAGNOSIS — Z86010 Personal history of colonic polyps: Secondary | ICD-10-CM | POA: Diagnosis not present

## 2023-07-01 DIAGNOSIS — Z1211 Encounter for screening for malignant neoplasm of colon: Secondary | ICD-10-CM | POA: Diagnosis not present

## 2023-07-01 DIAGNOSIS — E669 Obesity, unspecified: Secondary | ICD-10-CM | POA: Diagnosis not present

## 2023-07-01 DIAGNOSIS — Z85038 Personal history of other malignant neoplasm of large intestine: Secondary | ICD-10-CM | POA: Diagnosis not present

## 2023-07-01 DIAGNOSIS — K219 Gastro-esophageal reflux disease without esophagitis: Secondary | ICD-10-CM | POA: Diagnosis not present

## 2023-07-01 DIAGNOSIS — K5730 Diverticulosis of large intestine without perforation or abscess without bleeding: Secondary | ICD-10-CM | POA: Diagnosis not present

## 2023-08-27 DIAGNOSIS — Z1211 Encounter for screening for malignant neoplasm of colon: Secondary | ICD-10-CM | POA: Diagnosis not present

## 2023-08-27 DIAGNOSIS — K5730 Diverticulosis of large intestine without perforation or abscess without bleeding: Secondary | ICD-10-CM | POA: Diagnosis not present

## 2023-08-27 DIAGNOSIS — Z86010 Personal history of colonic polyps: Secondary | ICD-10-CM | POA: Diagnosis not present

## 2023-08-27 DIAGNOSIS — Z85038 Personal history of other malignant neoplasm of large intestine: Secondary | ICD-10-CM | POA: Diagnosis not present

## 2023-08-27 LAB — HM COLONOSCOPY

## 2023-10-07 DIAGNOSIS — Z1330 Encounter for screening examination for mental health and behavioral disorders, unspecified: Secondary | ICD-10-CM | POA: Diagnosis not present

## 2023-10-07 DIAGNOSIS — Z85038 Personal history of other malignant neoplasm of large intestine: Secondary | ICD-10-CM | POA: Diagnosis not present

## 2023-10-07 DIAGNOSIS — Z3040 Encounter for surveillance of contraceptives, unspecified: Secondary | ICD-10-CM | POA: Diagnosis not present

## 2023-10-07 DIAGNOSIS — Z6834 Body mass index (BMI) 34.0-34.9, adult: Secondary | ICD-10-CM | POA: Diagnosis not present

## 2023-10-07 DIAGNOSIS — Z01419 Encounter for gynecological examination (general) (routine) without abnormal findings: Secondary | ICD-10-CM | POA: Diagnosis not present

## 2023-10-07 DIAGNOSIS — R6882 Decreased libido: Secondary | ICD-10-CM | POA: Diagnosis not present

## 2023-10-07 DIAGNOSIS — Z1231 Encounter for screening mammogram for malignant neoplasm of breast: Secondary | ICD-10-CM | POA: Diagnosis not present

## 2023-11-12 ENCOUNTER — Ambulatory Visit (INDEPENDENT_AMBULATORY_CARE_PROVIDER_SITE_OTHER): Payer: 59 | Admitting: Internal Medicine

## 2023-11-12 ENCOUNTER — Encounter: Payer: Self-pay | Admitting: Internal Medicine

## 2023-11-12 VITALS — BP 130/80 | HR 69 | Temp 98.2°F | Ht 65.0 in | Wt 205.0 lb

## 2023-11-12 DIAGNOSIS — G44319 Acute post-traumatic headache, not intractable: Secondary | ICD-10-CM | POA: Diagnosis not present

## 2023-11-12 DIAGNOSIS — J069 Acute upper respiratory infection, unspecified: Secondary | ICD-10-CM | POA: Insufficient documentation

## 2023-11-12 DIAGNOSIS — R519 Headache, unspecified: Secondary | ICD-10-CM | POA: Insufficient documentation

## 2023-11-12 DIAGNOSIS — M25561 Pain in right knee: Secondary | ICD-10-CM | POA: Insufficient documentation

## 2023-11-12 MED ORDER — PREDNISONE 20 MG PO TABS
40.0000 mg | ORAL_TABLET | Freq: Every day | ORAL | 0 refills | Status: DC
Start: 2023-11-12 — End: 2023-11-12

## 2023-11-12 MED ORDER — FLUTICASONE PROPIONATE 50 MCG/ACT NA SUSP: 2.0000 | Freq: Every day | NASAL | 3 refills | Status: DC

## 2023-11-12 MED ORDER — PREDNISONE 20 MG PO TABS: 40.0000 mg | ORAL_TABLET | Freq: Every day | ORAL | 0 refills | Status: DC

## 2023-11-12 NOTE — Patient Instructions (Signed)
We have sent in flonase and have you watch for pain or concussion
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
## 2023-11-12 NOTE — Assessment & Plan Note (Signed)
Acute on chronic right knee disturbance. She likely had injury in the past. Rx prednisone 5 day course for 2 weeks pain. If no improvement will check x-ray knee.

## 2023-11-12 NOTE — Assessment & Plan Note (Signed)
Symptoms ongoing about 1 week. Rx flonase to help with drainage. Cough is actually improving slightly and less frequent. Offered cough medicine but she is using nyquil successfully and did not need.

## 2023-12-02 ENCOUNTER — Encounter: Payer: Self-pay | Admitting: Internal Medicine

## 2023-12-02 ENCOUNTER — Ambulatory Visit (INDEPENDENT_AMBULATORY_CARE_PROVIDER_SITE_OTHER): Payer: 59 | Admitting: Internal Medicine

## 2023-12-02 VITALS — BP 140/80 | HR 74 | Temp 96.4°F | Ht 65.0 in | Wt 208.0 lb

## 2023-12-02 DIAGNOSIS — R030 Elevated blood-pressure reading, without diagnosis of hypertension: Secondary | ICD-10-CM

## 2023-12-02 DIAGNOSIS — R7301 Impaired fasting glucose: Secondary | ICD-10-CM

## 2023-12-02 DIAGNOSIS — K645 Perianal venous thrombosis: Secondary | ICD-10-CM | POA: Diagnosis not present

## 2023-12-02 MED ORDER — HYDROCORTISONE (PERIANAL) 2.5 % EX CREA: 1.0000 | TOPICAL_CREAM | Freq: Two times a day (BID) | CUTANEOUS | 0 refills | Status: DC

## 2023-12-02 MED ORDER — LIDOCAINE-PRILOCAINE 2.5-2.5 % EX CREA: 1.0000 | TOPICAL_CREAM | CUTANEOUS | 1 refills | Status: DC | PRN

## 2023-12-02 NOTE — Progress Notes (Signed)
Patient ID: Amanda Bernard, female   DOB: 12/23/1970, 52 y.o.   MRN: [id number]        Chief Complaint: follow up new hemorrhoid       HPI:  Amanda Bernard is a 52 y.o. female here with c/o acute onset < 1 wk anal pain with lump tender warm but no bleeding, new onset, cannot recall straining or recent constipation, Denies worsening reflux, abd pain, dysphagia, n/v, bowel change or blood.  Pt denies chest pain, increased sob or doe, wheezing, orthopnea, PND, increased LE swelling, palpitations, dizziness or syncope.   Pt denies polydipsia, polyuria, or new focal neuro s/s.          Wt Readings from Last 3 Encounters:  12/02/23 208 lb (94.3 kg)  11/12/23 205 lb (93 kg)  03/24/23 210 lb (95.3 kg)   BP Readings from Last 3 Encounters:  12/02/23 (!) 140/80  11/12/23 130/80  03/24/23 (!) 170/100         Past Medical History:  Diagnosis Date   Anxiety    Colon cancer (HCC)    GERD (gastroesophageal reflux disease)    Lung nodule    Obesity    Past Surgical History:  Procedure Laterality Date   BRONCHIAL BIOPSY  04/23/2020   Procedure: BRONCHIAL BIOPSIES;  Surgeon: Josephine Igo, DO;  Location: [facility];  Service: Pulmonary;;   BRONCHIAL BRUSHINGS  04/23/2020   Procedure: BRONCHIAL BRUSHINGS;  Surgeon: Josephine Igo, DO;  Location: [facility];  Service: Pulmonary;;   BRONCHIAL NEEDLE ASPIRATION BIOPSY  04/23/2020   Procedure: BRONCHIAL NEEDLE ASPIRATION BIOPSIES;  Surgeon: Josephine Igo, DO;  Location: [facility];  Service: Pulmonary;;   BRONCHIAL WASHINGS  04/23/2020   Procedure: BRONCHIAL WASHINGS;  Surgeon: Josephine Igo, DO;  Location: [facility];  Service: Pulmonary;;   CESAREAN SECTION     x 2   COLONOSCOPY     FIDUCIAL MARKER PLACEMENT  04/23/2020   Procedure: FIDUCIAL MARKER PLACEMENT;  Surgeon: Josephine Igo, DO;  Location: [facility];  Service: Pulmonary;;   TUBAL LIGATION     VIDEO BRONCHOSCOPY WITH ENDOBRONCHIAL NAVIGATION N/A 04/23/2020    Procedure: VIDEO BRONCHOSCOPY WITH ENDOBRONCHIAL NAVIGATION;  Surgeon: Josephine Igo, DO;  Location: [facility];  Service: Pulmonary;  Laterality: N/A;   WISDOM TOOTH EXTRACTION      reports that she quit smoking about 14 years ago. Her smoking use included cigarettes. She started smoking about 39 years ago. She has a 25 pack-year smoking history. She has never used smokeless tobacco. She reports current alcohol use of about 20.0 standard drinks of alcohol per week. She reports that she does not currently use drugs after having used the following drugs: Marijuana. family history includes Cancer in her paternal grandfather and paternal uncle; Cancer (age of onset: 1) in her child; Cancer (age of onset: 31) in her maternal grandmother. No Known Allergies Current Outpatient Medications on File Prior to Visit  Medication Sig Dispense Refill   B Complex Vitamins (VITAMIN B COMPLEX 100 IJ) Take 1 tablet by mouth daily.      cetirizine (ZYRTEC) 10 MG tablet Take 10 mg by mouth daily.     Cholecalciferol (VITAMIN D3 PO) Take 1 tablet by mouth daily.     COLLAGEN PO Take 1 tablet by mouth daily. Peptides     ferrous sulfate (SLOW RELEASE IRON) 160 (50 Fe) MG TBCR SR tablet      fluticasone (FLONASE ALLERGY RELIEF) 50 MCG/ACT nasal spray Place  2 sprays into both nostrils daily. 48 g 3   minoxidil (LONITEN) 2.5 MG tablet Take 2.5 mg by mouth daily.     Multiple Vitamins-Minerals (HAIR SKIN & NAILS PO) Take 1 capsule by mouth daily.     Multiple Vitamins-Minerals (MULTIVITAMIN ADULT EXTRA C PO) Take 1 tablet by mouth daily.      predniSONE (DELTASONE) 20 MG tablet Take 2 tablets (40 mg total) by mouth daily with breakfast. 10 tablet 0   Pumpkin Seed 500 MG CAPS Take 2 capsules by mouth daily.     [employer] Fiber Gummy Bears Children CHEW Chew 2 tablets by mouth daily.     No current facility-administered medications on file prior to visit.        ROS:  All others reviewed and negative.  Objective         PE:  BP (!) 140/80 (BP Location: Left Arm, Patient Position: Sitting, Cuff Size: Normal)   Pulse 74   Temp (!) 96.4 F (35.8 C) (Oral)   Ht 5\' 5"  (1.651 m)   Wt 208 lb (94.3 kg)   SpO2 96%   BMI 34.61 kg/m                 Constitutional: Pt appears in NAD               HENT: Head: NCAT.                Right Ear: External ear normal.                 Left Ear: External ear normal.                Eyes: . Pupils are equal, round, and reactive to light. Conjunctivae and EOM are normal               Nose: without d/c or deformity               Neck: Neck supple. Gross normal ROM               Cardiovascular: Normal rate and regular rhythm.                 Pulmonary/Chest: Effort normal and breath sounds without rales or wheezing.                               Neurological: Pt is alert. At baseline orientation, motor grossly intact               Skin: Skin is warm. No rashes, no other new lesions, LE edema - none               Psychiatric: Pt behavior is normal without agitation   Micro: none  Cardiac tracings I have personally interpreted today:  none  Pertinent Radiological findings (summarize): none   Lab Results  Component Value Date   WBC 8.4 03/18/2023   HGB 14.3 03/18/2023   HCT 43.3 03/18/2023   PLT 274 03/18/2023   GLUCOSE 109 (H) 03/18/2023   CHOL 173 03/24/2023   TRIG 135.0 03/24/2023   HDL 53.50 03/24/2023   LDLCALC 92 03/24/2023   ALT 16 04/19/2020   AST 16 04/19/2020   NA 140 03/18/2023   K 3.5 03/18/2023   CL 104 03/18/2023   CREATININE 0.72 03/18/2023   BUN 11 03/18/2023   CO2 27 03/18/2023   INR 1.0 04/19/2020  HGBA1C 5.4 03/24/2023   Assessment/Plan:  Amanda Bernard is a 52 y.o. White or Caucasian [1] female with  has a past medical history of Anxiety, Colon cancer (HCC), GERD (gastroesophageal reflux disease), Lung nodule, and Obesity.  Thrombosed external hemorrhoid Mild to mod, for anusol hx asd, emla cream prn pain,  to f/u any worsening  symptoms or concerns  Elevated blood pressure reading Mild, likely situational,  to f/u any worsening symptoms or concerns and bp at home and next visit  Impaired fasting blood sugar Lab Results  Component Value Date   HGBA1C 5.4 03/24/2023   Stable, pt to continue current medical treatment  - diet wt control  Followup: Return if symptoms worsen or fail to improve.  Oliver Barre, MD 12/04/2023 7:27 PM [facility]

## 2023-12-02 NOTE — Patient Instructions (Addendum)
Please take all new medication as prescribed - the steroid cream, and the one  for pain as well  Please continue all other medications as before, and refills have been done if requested.  Please have the pharmacy call with any other refills you may need.  Please continue your efforts at being more active, low cholesterol diet, and weight control.  Please keep your appointments with your specialists as you may have planned

## 2023-12-04 ENCOUNTER — Encounter: Payer: Self-pay | Admitting: Internal Medicine

## 2023-12-04 DIAGNOSIS — K645 Perianal venous thrombosis: Secondary | ICD-10-CM | POA: Insufficient documentation

## 2023-12-04 NOTE — Assessment & Plan Note (Signed)
Lab Results  Component Value Date   HGBA1C 5.4 03/24/2023   Stable, pt to continue current medical treatment  - diet wt control

## 2023-12-04 NOTE — Assessment & Plan Note (Signed)
Mild, likely situational,  to f/u any worsening symptoms or concerns and bp at home and next visit

## 2023-12-04 NOTE — Assessment & Plan Note (Signed)
Mild to mod, for anusol hx asd, emla cream prn pain,  to f/u any worsening symptoms or concerns

## 2023-12-17 DIAGNOSIS — L821 Other seborrheic keratosis: Secondary | ICD-10-CM | POA: Diagnosis not present

## 2023-12-17 DIAGNOSIS — L814 Other melanin hyperpigmentation: Secondary | ICD-10-CM | POA: Diagnosis not present

## 2023-12-17 DIAGNOSIS — L570 Actinic keratosis: Secondary | ICD-10-CM | POA: Diagnosis not present

## 2023-12-17 DIAGNOSIS — D1801 Hemangioma of skin and subcutaneous tissue: Secondary | ICD-10-CM | POA: Diagnosis not present

## 2023-12-17 DIAGNOSIS — D2372 Other benign neoplasm of skin of left lower limb, including hip: Secondary | ICD-10-CM | POA: Diagnosis not present

## 2023-12-17 DIAGNOSIS — L918 Other hypertrophic disorders of the skin: Secondary | ICD-10-CM | POA: Diagnosis not present

## 2024-02-21 DIAGNOSIS — M722 Plantar fascial fibromatosis: Secondary | ICD-10-CM | POA: Diagnosis not present

## 2024-02-21 DIAGNOSIS — M25371 Other instability, right ankle: Secondary | ICD-10-CM | POA: Diagnosis not present

## 2024-02-21 DIAGNOSIS — M792 Neuralgia and neuritis, unspecified: Secondary | ICD-10-CM | POA: Diagnosis not present

## 2024-02-21 DIAGNOSIS — M7731 Calcaneal spur, right foot: Secondary | ICD-10-CM | POA: Diagnosis not present

## 2024-02-21 DIAGNOSIS — M21861 Other specified acquired deformities of right lower leg: Secondary | ICD-10-CM | POA: Diagnosis not present

## 2024-02-29 ENCOUNTER — Telehealth: Payer: Self-pay

## 2024-02-29 DIAGNOSIS — Z122 Encounter for screening for malignant neoplasm of respiratory organs: Secondary | ICD-10-CM

## 2024-02-29 DIAGNOSIS — Z87891 Personal history of nicotine dependence: Secondary | ICD-10-CM

## 2024-02-29 NOTE — Telephone Encounter (Signed)
.  Lung Cancer Screening Narrative/Criteria Questionnaire (Cigarette Smokers Only- No Cigars/Pipes/vapes)   Amanda Bernard   SDMV:03/14/2024 at 9:00 am Amanda Bernard       October 18, 1971   LDCT: 03/15/2024 at 9:40 am at [facility]    53 y.o.   Phone: [phone]  Lung Screening Narrative (confirm age 53-77 yrs Medicare / 50-80 yrs Private pay insurance)   Insurance information:Aetna/Medicaid   Referring Provider:[facility]   This screening involves an initial phone call with a team member from our program. It is called a shared decision making visit. The initial meeting is required by  insurance and Medicare to make sure you understand the program. This appointment takes about 15-20 minutes to complete. You will complete the screening scan at your scheduled date/time.  This scan takes about 5-10 minutes to complete. You can eat and drink normally before and after the scan.  Criteria questions for Lung Cancer Screening:   Are you a current or former smoker? Former Age began smoking: 12   If you are a former smoker, what year did you quit smoking? Quit 2013 To calculate your smoking history, I need an accurate estimate of how many packs of cigarettes you smoked per day and for how many years. (Not just the number of PPD you are now smoking)   Years smoking 41 x Packs per day 2 = Pack years 82   (at least 20 pack yrs)   (Make sure they understand that we need to know how much they have smoked in the past, not just the number of PPD they are smoking now)  Do you have a personal history of cancer?  No    Do you have a family history of cancer? Yes  (cancer type and and relative) Sister skin cancer, Son Testicular, Grandfather Lung cancer, Mother Breast cancer,  Aunt skin cancer and Uncle brain cancer  Are you coughing up blood?  No  Have you had unexplained weight loss of 15 lbs or more in the last 6 months? No  It looks like you meet all criteria.  When would be a good time for [place] to schedule you  for this screening?   Additional information: N/A

## 2024-03-09 ENCOUNTER — Encounter: Payer: Self-pay | Admitting: Acute Care

## 2024-03-14 ENCOUNTER — Ambulatory Visit (INDEPENDENT_AMBULATORY_CARE_PROVIDER_SITE_OTHER): Admitting: Physician Assistant

## 2024-03-14 ENCOUNTER — Encounter: Payer: Self-pay | Admitting: Physician Assistant

## 2024-03-14 DIAGNOSIS — Z87891 Personal history of nicotine dependence: Secondary | ICD-10-CM | POA: Diagnosis not present

## 2024-03-14 NOTE — Progress Notes (Signed)
 Virtual Visit via Telephone Note  I connected with Amanda Bernard on 03/14/24 at 8:54 AM by telephone and verified that I am speaking with the correct person using two identifiers.  Location: Patient: home Provider: working virtually from home   I discussed the limitations, risks, security and privacy concerns of performing an evaluation and management service by telephone and the availability of in person appointments. I also discussed with the patient that there may be a patient responsible charge related to this service. The patient expressed understanding and agreed to proceed.       Shared Decision Making Visit Lung Cancer Screening Program [phone])   Eligibility: Age 15 Pack Years Smoking History Calculation 10 (# packs/per year x # years smoked) Recent History of coughing up blood  no Unexplained weight loss? No ( >Than 15 pounds within the last 6 months ) Prior History Lung / other cancer No (Diagnosis within the last 5 years already requiring surveillance chest CT Scans). Smoking Status Former Smoker Former Smokers: Years since quit: 12 years  Quit Date: 2013  Visit Components: Discussion included one or more decision making aids? Yes Discussion included risk/benefits of screening. Yes Discussion included potential follow up diagnostic testing for abnormal scans. Yes Discussion included meaning and risk of over diagnosis. Yes Discussion included meaning and risk of False Positives. Yes Discussion included meaning of total radiation exposure. Yes  Counseling Included: Importance of adherence to annual lung cancer LDCT screening. Yes Impact of comorbidities on ability to participate in the program. Yes Ability and willingness to under diagnostic treatment. Yes  Smoking Cessation Counseling: Former Smokers:  Discussed the importance of maintaining cigarette abstinence. Yes Diagnosis Code: Personal History of Nicotine Dependence. U04.540 Information about  tobacco cessation classes and interventions provided to patient. Yes Written Order for Lung Cancer Screening with LDCT placed in Epic. Yes (CT Chest Lung Cancer Screening Low Dose W/O CM) [id number] Z12.2-Screening of respiratory organs Z87.891-Personal history of nicotine dependence    I spent 25 minutes of face to face time/virtual visit time  with the patient discussing the risks and benefits of lung cancer screening. We took the time to pause at intervals to allow for questions to be asked and answered to ensure understanding. We discussed that they had taken the single most powerful action possible to decrease their risk of developing lung cancer when they quit smoking. I counseled them to remain smoke free, and to contact the office if they ever had the desire to smoke again so that we can provide resources and tools to help support the effort to remain smoke free. We discussed the time and location of the scan, and they  will receive a call or letter with the results within  24-72 hours of receiving them. They have the office contact information in the event they have questions.   They verbalized understanding of all of the above and had no further questions.    I explained to the patient that there has been a high incidence of coronary artery disease noted on these exams. I explained that this is a non-gated exam therefore degree or severity cannot be determined. This patient is not on statin therapy. I have asked the patient to follow-up with their PCP regarding any incidental finding of coronary artery disease and management with diet or medication as they feel is clinically indicated. The patient verbalized understanding of the above and had no further questions.      Darcella Gasman Darlean Warmoth, PA-C

## 2024-03-14 NOTE — Patient Instructions (Signed)

## 2024-03-15 ENCOUNTER — Ambulatory Visit
Admission: RE | Admit: 2024-03-15 | Discharge: 2024-03-15 | Disposition: A | Discharge status: Home / Self Care | Source: Ambulatory Visit | Attending: Acute Care | Admitting: Acute Care

## 2024-03-15 DIAGNOSIS — Z87891 Personal history of nicotine dependence: Secondary | ICD-10-CM | POA: Diagnosis not present

## 2024-03-15 DIAGNOSIS — Z122 Encounter for screening for malignant neoplasm of respiratory organs: Secondary | ICD-10-CM | POA: Diagnosis not present

## 2024-03-29 DIAGNOSIS — M722 Plantar fascial fibromatosis: Secondary | ICD-10-CM | POA: Diagnosis not present

## 2024-03-29 DIAGNOSIS — M21861 Other specified acquired deformities of right lower leg: Secondary | ICD-10-CM | POA: Diagnosis not present

## 2024-03-29 DIAGNOSIS — M25371 Other instability, right ankle: Secondary | ICD-10-CM | POA: Diagnosis not present

## 2024-03-29 DIAGNOSIS — M792 Neuralgia and neuritis, unspecified: Secondary | ICD-10-CM | POA: Diagnosis not present

## 2024-03-29 DIAGNOSIS — M7731 Calcaneal spur, right foot: Secondary | ICD-10-CM | POA: Diagnosis not present

## 2024-04-17 ENCOUNTER — Other Ambulatory Visit: Payer: Self-pay | Admitting: Acute Care

## 2024-04-17 ENCOUNTER — Ambulatory Visit (INDEPENDENT_AMBULATORY_CARE_PROVIDER_SITE_OTHER): Admitting: Internal Medicine

## 2024-04-17 ENCOUNTER — Encounter: Payer: Self-pay | Admitting: Internal Medicine

## 2024-04-17 ENCOUNTER — Ambulatory Visit (INDEPENDENT_AMBULATORY_CARE_PROVIDER_SITE_OTHER)

## 2024-04-17 VITALS — BP 148/72 | HR 68 | Temp 97.7°F | Ht 65.0 in | Wt 208.8 lb

## 2024-04-17 DIAGNOSIS — M25552 Pain in left hip: Secondary | ICD-10-CM | POA: Insufficient documentation

## 2024-04-17 DIAGNOSIS — Z87891 Personal history of nicotine dependence: Secondary | ICD-10-CM

## 2024-04-17 DIAGNOSIS — R7301 Impaired fasting glucose: Secondary | ICD-10-CM | POA: Diagnosis not present

## 2024-04-17 DIAGNOSIS — L659 Nonscarring hair loss, unspecified: Secondary | ICD-10-CM | POA: Insufficient documentation

## 2024-04-17 DIAGNOSIS — I878 Other specified disorders of veins: Secondary | ICD-10-CM | POA: Diagnosis not present

## 2024-04-17 DIAGNOSIS — R030 Elevated blood-pressure reading, without diagnosis of hypertension: Secondary | ICD-10-CM | POA: Diagnosis not present

## 2024-04-17 DIAGNOSIS — M160 Bilateral primary osteoarthritis of hip: Secondary | ICD-10-CM | POA: Diagnosis not present

## 2024-04-17 DIAGNOSIS — Z122 Encounter for screening for malignant neoplasm of respiratory organs: Secondary | ICD-10-CM

## 2024-04-17 LAB — COMPREHENSIVE METABOLIC PANEL WITH GFR
ALT: 14 U/L (ref 0–35)
AST: 14 U/L (ref 0–37)
Albumin: 4.2 g/dL (ref 3.5–5.2)
Alkaline Phosphatase: 80 U/L (ref 39–117)
BUN: 10 mg/dL (ref 6–23)
CO2: 29 meq/L (ref 19–32)
Calcium: 9.1 mg/dL (ref 8.4–10.5)
Chloride: 105 meq/L (ref 96–112)
Creatinine, Ser: 0.63 mg/dL (ref 0.40–1.20)
GFR: 100.34 mL/min (ref >60.00)
Glucose, Bld: 103 mg/dL — ABNORMAL HIGH (ref 70–99)
Potassium: 3.9 meq/L (ref 3.5–5.1)
Sodium: 143 meq/L (ref 135–145)
Total Bilirubin: 0.3 mg/dL (ref 0.2–1.2)
Total Protein: 7 g/dL (ref 6.0–8.3)

## 2024-04-17 LAB — CBC
HCT: 41.7 % (ref 36.0–46.0)
Hemoglobin: 14 g/dL (ref 12.0–15.0)
MCHC: 33.6 g/dL (ref 30.0–36.0)
MCV: 94.4 fl (ref 78.0–100.0)
Platelets: 298 10*3/uL (ref 150.0–400.0)
RBC: 4.41 Mil/uL (ref 3.87–5.11)
RDW: 13.5 % (ref 11.5–15.5)
WBC: 5.5 10*3/uL (ref 4.0–10.5)

## 2024-04-17 LAB — LIPID PANEL
Cholesterol: 197 mg/dL (ref 0–200)
HDL: 66.1 mg/dL (ref >39.00)
LDL Cholesterol: 110 mg/dL — ABNORMAL HIGH (ref 0–99)
NonHDL: 130.52
Total CHOL/HDL Ratio: 3
Triglycerides: 104 mg/dL (ref 0.0–149.0)
VLDL: 20.8 mg/dL (ref 0.0–40.0)

## 2024-04-17 LAB — HEMOGLOBIN A1C: Hgb A1c MFr Bld: 5.4 % (ref 4.6–6.5)

## 2024-04-17 LAB — VITAMIN D 25 HYDROXY (VIT D DEFICIENCY, FRACTURES): VITD: 62.29 ng/mL (ref 30.00–100.00)

## 2024-04-17 LAB — VITAMIN B12: Vitamin B-12: 457 pg/mL (ref 211–911)

## 2024-04-17 MED ORDER — CYCLOBENZAPRINE HCL 5 MG PO TABS: 5.0000 mg | ORAL_TABLET | Freq: Three times a day (TID) | ORAL | 1 refills | Status: AC | PRN

## 2024-04-17 MED ORDER — MINOXIDIL 2.5 MG PO TABS: 2.5000 mg | ORAL_TABLET | Freq: Every day | ORAL | 3 refills | Status: AC

## 2024-04-17 NOTE — Assessment & Plan Note (Signed)
 Rx flexeril and checking x-ray left hip. It is possibly arthritis or tendon/muscle strain from plantar fasciitis.

## 2024-04-17 NOTE — Progress Notes (Signed)
   Subjective:   Patient ID: Amanda Bernard, female    DOB: Oct 28, 1971, 53 y.o.   MRN: [id number]  Hip Pain    The patient is a 53 YO female coming in for about 1 week of left hip pain. Started with going up stairs. Hurts with bending down, in the morning, after sitting for awhile. Has had some morning pain/stiffness for some time. Has had some problems with right foot lately wonders if that is related. Does want follow up medical conditions as well today. Needs minoxidil for hair loss as well getting through hers wants to get through us .   Review of Systems  Constitutional: Negative.   HENT: Negative.    Eyes: Negative.   Respiratory:  Negative for cough, chest tightness and shortness of breath.   Cardiovascular:  Negative for chest pain, palpitations and leg swelling.  Gastrointestinal:  Negative for abdominal distention, abdominal pain, constipation, diarrhea, nausea and vomiting.  Musculoskeletal:  Positive for arthralgias.  Skin: Negative.   Neurological: Negative.   Psychiatric/Behavioral: Negative.      Objective:  Physical Exam Constitutional:      Appearance: She is well-developed.  HENT:     Head: Normocephalic and atraumatic.  Cardiovascular:     Rate and Rhythm: Normal rate and regular rhythm.  Pulmonary:     Effort: Pulmonary effort is normal. No respiratory distress.     Breath sounds: Normal breath sounds. No wheezing or rales.  Abdominal:     General: Bowel sounds are normal. There is no distension.     Palpations: Abdomen is soft.     Tenderness: There is no abdominal tenderness. There is no rebound.  Musculoskeletal:        General: Tenderness present.     Cervical back: Normal range of motion.     Comments: Pain deep left hip region not tender on the surface or to palpation. Pain with movement.  Skin:    General: Skin is warm and dry.  Neurological:     Mental Status: She is alert and oriented to person, place, and time.     Coordination: Coordination  normal.     Vitals:   04/17/24 0931  BP: (!) 148/72  Pulse: 68  Temp: 97.7 F (36.5 C)  SpO2: 99%  Weight: 208 lb 12.8 oz (94.7 kg)  Height: 5\' 5"  (1.651 m)    Assessment & Plan:

## 2024-04-17 NOTE — Assessment & Plan Note (Signed)
 BP mildly elevated today and she is in active pain.

## 2024-04-17 NOTE — Patient Instructions (Signed)
 We have sent in flexeril which is the muscle relaxer to use up to 3 times a day.  We will do the x-ray.  We have sent in the minoxidil.

## 2024-04-17 NOTE — Assessment & Plan Note (Signed)
 Rx minoxidil 2.5 mg daily oral done today and she has had success with this. Checking CMP and vitamin D and B12.

## 2024-04-17 NOTE — Assessment & Plan Note (Signed)
 Checking HgA1c and adjust as needed.

## 2024-04-19 ENCOUNTER — Ambulatory Visit: Payer: Self-pay | Admitting: Internal Medicine

## 2024-04-19 DIAGNOSIS — M25552 Pain in left hip: Secondary | ICD-10-CM

## 2024-04-25 ENCOUNTER — Encounter: Payer: Self-pay | Admitting: Internal Medicine

## 2024-04-25 MED ORDER — MELOXICAM 15 MG PO TABS: 15.0000 mg | ORAL_TABLET | Freq: Every day | ORAL | 0 refills | Status: DC

## 2024-04-26 ENCOUNTER — Other Ambulatory Visit: Payer: Self-pay

## 2024-04-26 MED ORDER — MELOXICAM 15 MG PO TABS: 15.0000 mg | ORAL_TABLET | Freq: Every day | ORAL | 0 refills | Status: DC

## 2024-04-26 NOTE — Telephone Encounter (Signed)
 States Meloxicam was to be sent to [facility].

## 2024-04-26 NOTE — Telephone Encounter (Unsigned)
 Copied from CRM [id number]. Topic: Clinical - Prescription Issue >> Apr 26, 2024  8:29 AM Jenice Mitts wrote: Reason for CRM: Patient is calling because her prescription for meloxicam (MOBIC) 15 MG tablet Was supposed to go to the pharmacy [facility].   [facility] [address] [place] - [address] [facility] [place] [address] Phone: [phone] Fax: [phone]

## 2024-04-26 NOTE — Telephone Encounter (Signed)
 Yes was sent in on 5/20

## 2024-05-10 DIAGNOSIS — M722 Plantar fascial fibromatosis: Secondary | ICD-10-CM | POA: Diagnosis not present

## 2024-05-10 DIAGNOSIS — M792 Neuralgia and neuritis, unspecified: Secondary | ICD-10-CM | POA: Diagnosis not present

## 2024-05-10 DIAGNOSIS — M7731 Calcaneal spur, right foot: Secondary | ICD-10-CM | POA: Diagnosis not present

## 2024-05-10 DIAGNOSIS — M21861 Other specified acquired deformities of right lower leg: Secondary | ICD-10-CM | POA: Diagnosis not present

## 2024-05-10 DIAGNOSIS — M25371 Other instability, right ankle: Secondary | ICD-10-CM | POA: Diagnosis not present

## 2024-05-15 NOTE — Therapy (Signed)
 OUTPATIENT PHYSICAL THERAPY LOWER EXTREMITY EVALUATION   Patient Name: Amanda Bernard MRN: [id number] DOB:[birth year], 53 y.o., female Today's Date: 05/15/2024  END OF SESSION:   Past Medical History:  Diagnosis Date   Anxiety    Colon cancer (HCC)    GERD (gastroesophageal reflux disease)    Lung nodule    Obesity    Past Surgical History:  Procedure Laterality Date   BRONCHIAL BIOPSY  04/23/2020   Procedure: BRONCHIAL BIOPSIES;  Surgeon: Prudy Brownie, DO;  Location: [facility];  Service: Pulmonary;;   BRONCHIAL BRUSHINGS  04/23/2020   Procedure: BRONCHIAL BRUSHINGS;  Surgeon: Prudy Brownie, DO;  Location: [facility];  Service: Pulmonary;;   BRONCHIAL NEEDLE ASPIRATION BIOPSY  04/23/2020   Procedure: BRONCHIAL NEEDLE ASPIRATION BIOPSIES;  Surgeon: Prudy Brownie, DO;  Location: [facility];  Service: Pulmonary;;   BRONCHIAL WASHINGS  04/23/2020   Procedure: BRONCHIAL WASHINGS;  Surgeon: Prudy Brownie, DO;  Location: [facility];  Service: Pulmonary;;   CESAREAN SECTION     x 2   COLONOSCOPY     FIDUCIAL MARKER PLACEMENT  04/23/2020   Procedure: FIDUCIAL MARKER PLACEMENT;  Surgeon: Prudy Brownie, DO;  Location: [facility];  Service: Pulmonary;;   TUBAL LIGATION     VIDEO BRONCHOSCOPY WITH ENDOBRONCHIAL NAVIGATION N/A 04/23/2020   Procedure: VIDEO BRONCHOSCOPY WITH ENDOBRONCHIAL NAVIGATION;  Surgeon: Prudy Brownie, DO;  Location: [facility];  Service: Pulmonary;  Laterality: N/A;   WISDOM TOOTH EXTRACTION     Patient Active Problem List   Diagnosis Date Noted   Left hip pain 04/17/2024   Hair loss 04/17/2024   Thrombosed external hemorrhoid 12/04/2023   Headache 11/12/2023   Viral URI 11/12/2023   Right knee pain 11/12/2023   Elevated blood pressure reading 03/25/2023   Impaired fasting blood sugar 03/25/2023   Routine general medical examination at a health care facility 03/25/2023   Lung nodule    Cancer of sigmoid colon (HCC) 03/28/2020    Nodule of lower lobe of right lung 03/28/2020    PCP: Bambi Lever  REFERRING PROVIDER: Bambi Lever  REFERRING DIAG: L hip pain  THERAPY DIAG:  No diagnosis found.  Rationale for Evaluation and Treatment: Rehabilitation  ONSET DATE: 04/17/24  SUBJECTIVE:   SUBJECTIVE STATEMENT: ***  PERTINENT HISTORY: The patient is a 53 YO female coming in for about 1 week of left hip pain. Started with going up stairs. Hurts with bending down, in the morning, after sitting for awhile. Has had some morning pain/stiffness for some time. Has had some problems with right foot lately wonders if that is related.   PAIN:  Are you having pain? {OPRCPAIN:27236}  PRECAUTIONS: {Therapy precautions:24002}  RED FLAGS: {PT Red Flags:29287}   WEIGHT BEARING RESTRICTIONS: {Yes ***/No:24003}  FALLS:  Has patient fallen in last 6 months? {fallsyesno:27318}  LIVING ENVIRONMENT: Lives with: {OPRC lives with:25569::"lives with their family"} Lives in: {Lives in:25570} Stairs: {opstairs:27293} Has following equipment at home: {Assistive devices:23999}  OCCUPATION: ***  PLOF: {PLOF:24004}  PATIENT GOALS: ***  NEXT MD VISIT: ***  OBJECTIVE:  Note: Objective measures were completed at Evaluation unless otherwise noted.  DIAGNOSTIC FINDINGS: FINDINGS: Mildly decreased bone mineralization. Mild bilateral sacroiliac subchondral sclerosis. The bilateral femoroacetabular and pubic symphysis joint spaces are maintained. No acute fracture or dislocation. Vascular phleboliths overlie the left hemipelvis.   IMPRESSION: Mild bilateral sacroiliac osteoarthritis  PATIENT SURVEYS:  {rehab surveys:24030}  COGNITION: Overall cognitive status: {cognition:24006}     SENSATION: {sensation:27233}  EDEMA:  {edema:24020}  MUSCLE LENGTH: Hamstrings: Right *** deg; Left *** deg Andy Bannister test: Right *** deg; Left *** deg  POSTURE: {posture:25561}  PALPATION: ***  LOWER EXTREMITY  ROM:  {AROM/PROM:27142} ROM Right eval Left eval  Hip flexion    Hip extension    Hip abduction    Hip adduction    Hip internal rotation    Hip external rotation    Knee flexion    Knee extension    Ankle dorsiflexion    Ankle plantarflexion    Ankle inversion    Ankle eversion     (Blank rows = not tested)  LOWER EXTREMITY MMT:  MMT Right eval Left eval  Hip flexion    Hip extension    Hip abduction    Hip adduction    Hip internal rotation    Hip external rotation    Knee flexion    Knee extension    Ankle dorsiflexion    Ankle plantarflexion    Ankle inversion    Ankle eversion     (Blank rows = not tested)  LOWER EXTREMITY SPECIAL TESTS:  {LEspecialtests:26242}  FUNCTIONAL TESTS:  {Functional tests:24029}  GAIT: Distance walked: *** Assistive device utilized: {Assistive devices:23999} Level of assistance: {Levels of assistance:24026} Comments: ***                                                                                                                                TREATMENT DATE: ***    PATIENT EDUCATION:  Education details: *** Person educated: {Person educated:25204} Education method: {Education Method:25205} Education comprehension: {Education Comprehension:25206}  HOME EXERCISE PROGRAM: ***  ASSESSMENT:  CLINICAL IMPRESSION: Patient is a *** y.o. *** who was seen today for physical therapy evaluation and treatment for ***.   OBJECTIVE IMPAIRMENTS: {opptimpairments:25111}.   ACTIVITY LIMITATIONS: {activitylimitations:27494}  PARTICIPATION LIMITATIONS: {participationrestrictions:25113}  PERSONAL FACTORS: {Personal factors:25162} are also affecting patient's functional outcome.   REHAB POTENTIAL: {rehabpotential:25112}  CLINICAL DECISION MAKING: {clinical decision making:25114}  EVALUATION COMPLEXITY: {Evaluation complexity:25115}   GOALS: Goals reviewed with patient? {yes/no:20286}  SHORT TERM GOALS: Target date:  *** *** Baseline: Goal status: INITIAL  2.  *** Baseline:  Goal status: INITIAL  3.  *** Baseline:  Goal status: INITIAL  4.  *** Baseline:  Goal status: INITIAL  5.  *** Baseline:  Goal status: INITIAL  6.  *** Baseline:  Goal status: INITIAL  LONG TERM GOALS: Target date: ***  *** Baseline:  Goal status: INITIAL  2.  *** Baseline:  Goal status: INITIAL  3.  *** Baseline:  Goal status: INITIAL  4.  *** Baseline:  Goal status: INITIAL  5.  *** Baseline:  Goal status: INITIAL  6.  *** Baseline:  Goal status: INITIAL   PLAN:  PT FREQUENCY: {rehab frequency:25116}  PT DURATION: {rehab duration:25117}  PLANNED INTERVENTIONS: {rehab planned interventions:25118::"97110-Therapeutic exercises","97530- Therapeutic [phone]- Neuromuscular re-education","97535- Self HQIO","96295- Manual therapy"}  PLAN FOR NEXT SESSION: ***   Donavon Fudge, PT  05/15/2024, 3:50 PM

## 2024-05-16 ENCOUNTER — Ambulatory Visit: Attending: Internal Medicine

## 2024-05-16 DIAGNOSIS — M25552 Pain in left hip: Secondary | ICD-10-CM | POA: Insufficient documentation

## 2024-05-16 DIAGNOSIS — M722 Plantar fascial fibromatosis: Secondary | ICD-10-CM | POA: Diagnosis not present

## 2024-06-05 ENCOUNTER — Ambulatory Visit

## 2024-06-05 DIAGNOSIS — M722 Plantar fascial fibromatosis: Secondary | ICD-10-CM

## 2024-06-05 DIAGNOSIS — M25552 Pain in left hip: Secondary | ICD-10-CM | POA: Diagnosis not present

## 2024-06-05 NOTE — Therapy (Signed)
 OUTPATIENT PHYSICAL THERAPY LOWER EXTREMITY TREATMENT   Patient Name: SIMYA TERCERO MRN: [id number] DOB:[birth year], 53 y.o., female Today's Date: 06/05/2024  END OF SESSION:  PT End of Session - 06/05/24 1608     Visit Number 2    Date for PT Re-Evaluation 07/11/24    Authorization Type Aetna    PT Start Time 1615    PT Stop Time 1700    PT Time Calculation (min) 45 min           Past Medical History:  Diagnosis Date   Anxiety    Colon cancer (HCC)    GERD (gastroesophageal reflux disease)    Lung nodule    Obesity    Past Surgical History:  Procedure Laterality Date   BRONCHIAL BIOPSY  04/23/2020   Procedure: BRONCHIAL BIOPSIES;  Surgeon: Brenna Adine CROME, DO;  Location: [facility];  Service: Pulmonary;;   BRONCHIAL BRUSHINGS  04/23/2020   Procedure: BRONCHIAL BRUSHINGS;  Surgeon: Brenna Adine CROME, DO;  Location: [facility];  Service: Pulmonary;;   BRONCHIAL NEEDLE ASPIRATION BIOPSY  04/23/2020   Procedure: BRONCHIAL NEEDLE ASPIRATION BIOPSIES;  Surgeon: Brenna Adine CROME, DO;  Location: [facility];  Service: Pulmonary;;   BRONCHIAL WASHINGS  04/23/2020   Procedure: BRONCHIAL WASHINGS;  Surgeon: Brenna Adine CROME, DO;  Location: [facility];  Service: Pulmonary;;   CESAREAN SECTION     x 2   COLONOSCOPY     FIDUCIAL MARKER PLACEMENT  04/23/2020   Procedure: FIDUCIAL MARKER PLACEMENT;  Surgeon: Brenna Adine CROME, DO;  Location: [facility];  Service: Pulmonary;;   TUBAL LIGATION     VIDEO BRONCHOSCOPY WITH ENDOBRONCHIAL NAVIGATION N/A 04/23/2020   Procedure: VIDEO BRONCHOSCOPY WITH ENDOBRONCHIAL NAVIGATION;  Surgeon: Brenna Adine CROME, DO;  Location: [facility];  Service: Pulmonary;  Laterality: N/A;   WISDOM TOOTH EXTRACTION     Patient Active Problem List   Diagnosis Date Noted   Left hip pain 04/17/2024   Hair loss 04/17/2024   Thrombosed external hemorrhoid 12/04/2023   Headache 11/12/2023   Viral URI 11/12/2023   Right knee pain 11/12/2023   Elevated  blood pressure reading 03/25/2023   Impaired fasting blood sugar 03/25/2023   Routine general medical examination at a health care facility 03/25/2023   Lung nodule    Cancer of sigmoid colon (HCC) 03/28/2020   Nodule of lower lobe of right lung 03/28/2020    PCP: Almarie Cleveland  REFERRING PROVIDER: Almarie Cleveland  REFERRING DIAG: L hip pain  THERAPY DIAG:  Pain in left hip  Plantar fasciitis of right foot  Rationale for Evaluation and Treatment: Rehabilitation  ONSET DATE: 04/17/24  SUBJECTIVE:   SUBJECTIVE STATEMENT: I haven't done very much, I was at the beach. My knees are hurting bad. The hip is pretty good. The foot is still hurting.   I found out I have OA in my hip. It was hurting really bad a few weeks ago but now it is better. Doctor gave me meloxicam  and it stopped hurting so I stopped taking it. I also have bad plantar fasciitis in my R foot.   PERTINENT HISTORY: The patient is a 53 YO female coming in for about 1 week of left hip pain. Started with going up stairs. Hurts with bending down, in the morning, after sitting for awhile. Has had some morning pain/stiffness for some time. Has had some problems with right foot lately wonders if that is related.   PAIN:  Are you having pain? Yes: NPRS scale: 1/10  Pain location: L hip, R foot   Pain description: dull, achy  Aggravating factors: sleeping on L side  Relieving factors: Meloxicam  helped   PRECAUTIONS: None  RED FLAGS: None   WEIGHT BEARING RESTRICTIONS: No  FALLS:  Has patient fallen in last 6 months? No  LIVING ENVIRONMENT: Lives with: lives with their family Lives in: House/apartment Stairs: Yes: Internal: one set steps; on right going up  OCCUPATION: Accountant   PLOF: Independent  PATIENT GOALS: want to avoid having the pain again    OBJECTIVE:  Note: Objective measures were completed at Evaluation unless otherwise noted.  DIAGNOSTIC FINDINGS: FINDINGS: Mildly decreased bone  mineralization. Mild bilateral sacroiliac subchondral sclerosis. The bilateral femoroacetabular and pubic symphysis joint spaces are maintained. No acute fracture or dislocation. Vascular phleboliths overlie the left hemipelvis.   IMPRESSION: Mild bilateral sacroiliac osteoarthritis   COGNITION: Overall cognitive status: Within functional limits for tasks assessed     SENSATION: WFL  MUSCLE LENGTH: Hamstrings: mild tightness BLE Piriformis: tight on L side  LOWER EXTREMITY ROM: good ROM overall, WFL    LOWER EXTREMITY MMT: 5/5 BLE    LOWER EXTREMITY SPECIAL TESTS:  Hip special tests: Belvie (FABER) test: positive  and Hip scouring test: negative  FUNCTIONAL TESTS:  30 seconds chair stand test 9 reps                                                                                                                                TREATMENT DATE:  06/05/24 NuStep L5x41mins Hip stretches- HS, glute, SKTC Bridges x10 Feet on pball rotations, knees to chest  Lateral band walks  Monster walks  Calf stretch 15s x2  Calf raises  Taping for PF R foot  05/16/24 EVAL    PATIENT EDUCATION:  Education details: POC and HEP Person educated: Patient Education method: Medical illustrator Education comprehension: verbalized understanding and returned demonstration  HOME EXERCISE PROGRAM: Access Code: [email or web address] URL: [email or web address] Date: 05/16/2024 Prepared by: Almetta Fam  Exercises - Clamshell with Resistance  - 1 x daily - 7 x weekly - 2 sets - 10 reps - Supine Bridge with Resistance Band  - 1 x daily - 7 x weekly - 2 sets - 10 reps - Supine Piriformis Stretch with Foot on Ground  - 2 x daily - 7 x weekly - 2 sets - 2 reps - Supine Figure 4 Piriformis Stretch  - 2 x daily - 7 x weekly - 2 sets - 2 reps - 15 hold - Seated Piriformis Stretch with Trunk Bend  - 2 x daily - 7 x weekly - 2 sets - 2 reps - 15 hold - Gastroc Stretch on Wall  - 2 x daily -  7 x weekly - 2 sets - 2 reps - 15 hold - Heel Raises with Counter Support  - 1 x daily - 7 x weekly - 2 sets - 10 reps - Seated Plantar Fascia Stretch  -  2 x daily - 7 x weekly - 2 sets - 2 reps - 15 hold  ASSESSMENT:  CLINICAL IMPRESSION: Patient is a 53 y.o. female who was seen today for physical therapy treatment for bilateral knee pain and R foot pain due to plantar fasciitis. Her L hip is doing better. She worked on some gentle stretching and strengthening today. She tolerates it all well, only some pain with bridges so we only did one set. She will benefit from skilled PT to find ways to prevent her pain from returning and starting a gym program.   OBJECTIVE IMPAIRMENTS: decreased ROM, decreased strength, and pain.   REHAB POTENTIAL: Excellent  CLINICAL DECISION MAKING: Stable/uncomplicated  EVALUATION COMPLEXITY: Low   GOALS: Goals reviewed with patient? Yes  SHORT TERM GOALS: Target date: 06/13/24  Patient will be independent with initial HEP. Baseline:  Goal status: INITIAL    LONG TERM GOALS: Target date: 07/11/24  Patient will be independent with advanced/ongoing HEP to improve outcomes and carryover.  Baseline:  Goal status: INITIAL  2.  Patient will report no pain in L hip pain to improve QOL. Baseline: min pain 1/10 Goal status: INITIAL  3.  Patient will demonstrate improved functional LE strength as demonstrated by 30s chair test  for 12 reps Baseline: 9 reps  Goal status: INITIAL  4.  Patient will be able to navigate stairs without R foot and L hip pain Baseline:  Goal status: INITIAL   PLAN:  PT FREQUENCY: 1x/week  PT DURATION: 8 weeks  PLANNED INTERVENTIONS: 97110-Therapeutic exercises, 97530- Therapeutic activity, [id number]- Neuromuscular re-education, 97535- Self Care, 02859- Manual therapy, [id number]- Gait training, [phone]- Vasopneumatic device, [id number]- Ultrasound, [id number]- Ionotophoresis 4mg /ml Dexamethasone , 79439 (1-2 muscles), 20561 (3+ muscles)- Dry  Needling, Patient/Family education, Balance training, Stair training, Taping, Joint mobilization, Spinal mobilization, Cryotherapy, and Moist heat  PLAN FOR NEXT SESSION: hip stretching and strengthening, R plantar fasciitis how was taping?   [place], PT 06/05/2024, 5:01 PM

## 2024-06-12 ENCOUNTER — Ambulatory Visit: Attending: Internal Medicine

## 2024-06-12 DIAGNOSIS — M722 Plantar fascial fibromatosis: Secondary | ICD-10-CM | POA: Diagnosis not present

## 2024-06-12 DIAGNOSIS — M25552 Pain in left hip: Secondary | ICD-10-CM | POA: Diagnosis not present

## 2024-06-12 NOTE — Therapy (Signed)
 OUTPATIENT PHYSICAL THERAPY LOWER EXTREMITY TREATMENT   Patient Name: Amanda Bernard MRN: [id number] DOB:[birth year], 53 y.o., female Today's Date: 06/12/2024  END OF SESSION:  PT End of Session - 06/12/24 1607     Visit Number 3    Date for PT Re-Evaluation 07/11/24    Authorization Type [employer]    PT Start Time 1607    PT Stop Time 1650    PT Time Calculation (min) 43 min            Past Medical History:  Diagnosis Date   Anxiety    Colon cancer (HCC)    GERD (gastroesophageal reflux disease)    Lung nodule    Obesity    Past Surgical History:  Procedure Laterality Date   BRONCHIAL BIOPSY  04/23/2020   Procedure: BRONCHIAL BIOPSIES;  Surgeon: Brenna Adine CROME, DO;  Location: [facility];  Service: Pulmonary;;   BRONCHIAL BRUSHINGS  04/23/2020   Procedure: BRONCHIAL BRUSHINGS;  Surgeon: Brenna Adine CROME, DO;  Location: [facility];  Service: Pulmonary;;   BRONCHIAL NEEDLE ASPIRATION BIOPSY  04/23/2020   Procedure: BRONCHIAL NEEDLE ASPIRATION BIOPSIES;  Surgeon: Brenna Adine CROME, DO;  Location: [facility];  Service: Pulmonary;;   BRONCHIAL WASHINGS  04/23/2020   Procedure: BRONCHIAL WASHINGS;  Surgeon: Brenna Adine CROME, DO;  Location: [facility];  Service: Pulmonary;;   CESAREAN SECTION     x 2   COLONOSCOPY     FIDUCIAL MARKER PLACEMENT  04/23/2020   Procedure: FIDUCIAL MARKER PLACEMENT;  Surgeon: Brenna Adine CROME, DO;  Location: [facility];  Service: Pulmonary;;   TUBAL LIGATION     VIDEO BRONCHOSCOPY WITH ENDOBRONCHIAL NAVIGATION N/A 04/23/2020   Procedure: VIDEO BRONCHOSCOPY WITH ENDOBRONCHIAL NAVIGATION;  Surgeon: Brenna Adine CROME, DO;  Location: [facility];  Service: Pulmonary;  Laterality: N/A;   WISDOM TOOTH EXTRACTION     Patient Active Problem List   Diagnosis Date Noted   Left hip pain 04/17/2024   Hair loss 04/17/2024   Thrombosed external hemorrhoid 12/04/2023   Headache 11/12/2023   Viral URI 11/12/2023   Right knee pain 11/12/2023   Elevated  blood pressure reading 03/25/2023   Impaired fasting blood sugar 03/25/2023   Routine general medical examination at a health care facility 03/25/2023   Lung nodule    Cancer of sigmoid colon (HCC) 03/28/2020   Nodule of lower lobe of right lung 03/28/2020    PCP: Almarie Cleveland  REFERRING PROVIDER: Almarie Cleveland  REFERRING DIAG: L hip pain  THERAPY DIAG:  Pain in left hip  Plantar fasciitis of right foot  Rationale for Evaluation and Treatment: Rehabilitation  ONSET DATE: 04/17/24  SUBJECTIVE:   SUBJECTIVE STATEMENT: My foot always hurts but everything seems to be better.   I found out I have OA in my hip. It was hurting really bad a few weeks ago but now it is better. Doctor gave me meloxicam  and it stopped hurting so I stopped taking it. I also have bad plantar fasciitis in my R foot.   PERTINENT HISTORY: The patient is a 53 YO female coming in for about 1 week of left hip pain. Started with going up stairs. Hurts with bending down, in the morning, after sitting for awhile. Has had some morning pain/stiffness for some time. Has had some problems with right foot lately wonders if that is related.   PAIN:  Are you having pain? Yes: NPRS scale: 1/10 Pain location: L hip, R foot   Pain description: dull, achy  Aggravating  factors: sleeping on L side  Relieving factors: Meloxicam  helped   PRECAUTIONS: None  RED FLAGS: None   WEIGHT BEARING RESTRICTIONS: No  FALLS:  Has patient fallen in last 6 months? No  LIVING ENVIRONMENT: Lives with: lives with their family Lives in: House/apartment Stairs: Yes: Internal: one set steps; on right going up  OCCUPATION: Accountant   PLOF: Independent  PATIENT GOALS: want to avoid having the pain again    OBJECTIVE:  Note: Objective measures were completed at Evaluation unless otherwise noted.  DIAGNOSTIC FINDINGS: FINDINGS: Mildly decreased bone mineralization. Mild bilateral sacroiliac subchondral sclerosis.  The bilateral femoroacetabular and pubic symphysis joint spaces are maintained. No acute fracture or dislocation. Vascular phleboliths overlie the left hemipelvis.   IMPRESSION: Mild bilateral sacroiliac osteoarthritis   COGNITION: Overall cognitive status: Within functional limits for tasks assessed     SENSATION: WFL  MUSCLE LENGTH: Hamstrings: mild tightness BLE Piriformis: tight on L side  LOWER EXTREMITY ROM: good ROM overall, WFL    LOWER EXTREMITY MMT: 5/5 BLE    LOWER EXTREMITY SPECIAL TESTS:  Hip special tests: Belvie (FABER) test: positive  and Hip scouring test: negative  FUNCTIONAL TESTS:  30 seconds chair stand test 9 reps                                                                                                                                TREATMENT DATE:  06/12/24 NuStep L5 x3mins  Step ups 6  Calf stretch 15s x2 on slant Treadmill pushes 20s x2 STS 2x10  Resisted gait 20# 4 way x4 Supine clamshell green 2x10 Passive stretching for LE- HS, SKTC, ITB, piriformis Taping again for PF on R foot  06/05/24 NuStep L5x18mins Hip stretches- HS, glute, SKTC Bridges x10 Feet on pball rotations, knees to chest  Lateral band walks  Monster walks  Calf stretch 15s x2  Calf raises  Taping for PF R foot  05/16/24 EVAL    PATIENT EDUCATION:  Education details: POC and HEP Person educated: Patient Education method: Medical illustrator Education comprehension: verbalized understanding and returned demonstration  HOME EXERCISE PROGRAM: Access Code: [email or web address] URL: [email or web address] Date: 05/16/2024 Prepared by: Almetta Fam  Exercises - Clamshell with Resistance  - 1 x daily - 7 x weekly - 2 sets - 10 reps - Supine Bridge with Resistance Band  - 1 x daily - 7 x weekly - 2 sets - 10 reps - Supine Piriformis Stretch with Foot on Ground  - 2 x daily - 7 x weekly - 2 sets - 2 reps - Supine Figure 4 Piriformis Stretch  - 2 x  daily - 7 x weekly - 2 sets - 2 reps - 15 hold - Seated Piriformis Stretch with Trunk Bend  - 2 x daily - 7 x weekly - 2 sets - 2 reps - 15 hold - Gastroc Stretch on Wall  - 2 x daily - 7 x weekly - 2  sets - 2 reps - 15 hold - Heel Raises with Counter Support  - 1 x daily - 7 x weekly - 2 sets - 10 reps - Seated Plantar Fascia Stretch  - 2 x daily - 7 x weekly - 2 sets - 2 reps - 15 hold  ASSESSMENT:  CLINICAL IMPRESSION: Patient is a 53 y.o. female who was seen today for physical therapy treatment for bilateral knee pain and R foot pain due to plantar fasciitis. Her L hip is doing better. We continued with stretching and strengthening today. KT tape came off the next day so we tried again today without the thin strips. She will benefit from skilled PT to find ways to prevent her pain from returning and starting a gym program.   OBJECTIVE IMPAIRMENTS: decreased ROM, decreased strength, and pain.   REHAB POTENTIAL: Excellent  CLINICAL DECISION MAKING: Stable/uncomplicated  EVALUATION COMPLEXITY: Low   GOALS: Goals reviewed with patient? Yes  SHORT TERM GOALS: Target date: 06/13/24  Patient will be independent with initial HEP. Baseline:  Goal status: INITIAL    LONG TERM GOALS: Target date: 07/11/24  Patient will be independent with advanced/ongoing HEP to improve outcomes and carryover.  Baseline:  Goal status: INITIAL  2.  Patient will report no pain in L hip pain to improve QOL. Baseline: min pain 1/10 Goal status: INITIAL  3.  Patient will demonstrate improved functional LE strength as demonstrated by 30s chair test  for 12 reps Baseline: 9 reps  Goal status: INITIAL  4.  Patient will be able to navigate stairs without R foot and L hip pain Baseline:  Goal status: INITIAL   PLAN:  PT FREQUENCY: 1x/week  PT DURATION: 8 weeks  PLANNED INTERVENTIONS: 97110-Therapeutic exercises, 97530- Therapeutic activity, [id number]- Neuromuscular re-education, 97535- Self Care,  97140- Manual therapy, [id number]- Gait training, 02983- Vasopneumatic device, [id number]- Ultrasound, [id number]- Ionotophoresis 4mg /ml Dexamethasone , 79439 (1-2 muscles), 20561 (3+ muscles)- Dry Needling, Patient/Family education, Balance training, Stair training, Taping, Joint mobilization, Spinal mobilization, Cryotherapy, and Moist heat  PLAN FOR NEXT SESSION: hip stretching and strengthening, R plantar fasciitis how was taping?   [place], PT 06/12/2024, 4:47 PM

## 2024-06-21 ENCOUNTER — Ambulatory Visit

## 2024-06-26 ENCOUNTER — Ambulatory Visit

## 2024-07-04 ENCOUNTER — Other Ambulatory Visit: Payer: Self-pay | Admitting: Obstetrics and Gynecology

## 2024-07-04 DIAGNOSIS — Z1231 Encounter for screening mammogram for malignant neoplasm of breast: Secondary | ICD-10-CM

## 2024-09-06 ENCOUNTER — Encounter: Payer: Self-pay | Admitting: Internal Medicine

## 2024-09-06 NOTE — Telephone Encounter (Signed)
 Nonurgent question.  Can wait for PCP to get back to the office.

## 2024-09-06 NOTE — Telephone Encounter (Signed)
 Please advise for patient

## 2024-10-09 DIAGNOSIS — Z01419 Encounter for gynecological examination (general) (routine) without abnormal findings: Secondary | ICD-10-CM | POA: Diagnosis not present

## 2024-10-09 DIAGNOSIS — Z1342 Encounter for screening for global developmental delays (milestones): Secondary | ICD-10-CM | POA: Diagnosis not present

## 2024-10-09 DIAGNOSIS — Z124 Encounter for screening for malignant neoplasm of cervix: Secondary | ICD-10-CM | POA: Diagnosis not present

## 2024-10-09 DIAGNOSIS — Z85038 Personal history of other malignant neoplasm of large intestine: Secondary | ICD-10-CM | POA: Diagnosis not present

## 2024-10-09 DIAGNOSIS — Z1330 Encounter for screening examination for mental health and behavioral disorders, unspecified: Secondary | ICD-10-CM | POA: Diagnosis not present

## 2024-10-09 DIAGNOSIS — Z1231 Encounter for screening mammogram for malignant neoplasm of breast: Secondary | ICD-10-CM | POA: Diagnosis not present

## 2024-10-09 DIAGNOSIS — Z6834 Body mass index (BMI) 34.0-34.9, adult: Secondary | ICD-10-CM | POA: Diagnosis not present

## 2024-10-10 ENCOUNTER — Ambulatory Visit
Admission: RE | Admit: 2024-10-10 | Discharge: 2024-10-10 | Disposition: A | Discharge status: Home / Self Care | Source: Ambulatory Visit | Attending: Obstetrics and Gynecology | Admitting: Obstetrics and Gynecology

## 2024-10-10 DIAGNOSIS — Z1231 Encounter for screening mammogram for malignant neoplasm of breast: Secondary | ICD-10-CM | POA: Diagnosis not present

## 2024-11-20 ENCOUNTER — Ambulatory Visit: Payer: Self-pay | Admitting: Family Medicine

## 2024-11-20 ENCOUNTER — Ambulatory Visit: Admitting: Family Medicine

## 2024-11-20 VITALS — BP 138/78 | HR 60 | Temp 97.6°F | Resp 18 | Ht 65.0 in | Wt 210.0 lb

## 2024-11-20 DIAGNOSIS — I479 Paroxysmal tachycardia, unspecified: Secondary | ICD-10-CM | POA: Diagnosis not present

## 2024-11-20 DIAGNOSIS — J302 Other seasonal allergic rhinitis: Secondary | ICD-10-CM

## 2024-11-20 LAB — COMPREHENSIVE METABOLIC PANEL WITH GFR
ALT: 15 U/L (ref 0–35)
AST: 16 U/L (ref 0–37)
Albumin: 4.1 g/dL (ref 3.5–5.2)
Alkaline Phosphatase: 74 U/L (ref 39–117)
BUN: 7 mg/dL (ref 6–23)
CO2: 29 meq/L (ref 19–32)
Calcium: 9.3 mg/dL (ref 8.4–10.5)
Chloride: 106 meq/L (ref 96–112)
Creatinine, Ser: 0.6 mg/dL (ref 0.40–1.20)
GFR: 101.11 mL/min (ref >60.00)
Glucose, Bld: 110 mg/dL — ABNORMAL HIGH (ref 70–99)
Potassium: 4.1 meq/L (ref 3.5–5.1)
Sodium: 141 meq/L (ref 135–145)
Total Bilirubin: 0.4 mg/dL (ref 0.2–1.2)
Total Protein: 6.8 g/dL (ref 6.0–8.3)

## 2024-11-20 LAB — CBC WITH DIFFERENTIAL/PLATELET
Basophils Absolute: 0.1 K/uL (ref 0.0–0.1)
Basophils Relative: 1.2 % (ref 0.0–3.0)
Eosinophils Absolute: 0.1 K/uL (ref 0.0–0.7)
Eosinophils Relative: 2.8 % (ref 0.0–5.0)
HCT: 41.4 % (ref 36.0–46.0)
Hemoglobin: 13.8 g/dL (ref 12.0–15.0)
Lymphocytes Relative: 30.8 % (ref 12.0–46.0)
Lymphs Abs: 1.6 K/uL (ref 0.7–4.0)
MCHC: 33.3 g/dL (ref 30.0–36.0)
MCV: 92.4 fl (ref 78.0–100.0)
Monocytes Absolute: 0.3 K/uL (ref 0.1–1.0)
Monocytes Relative: 6.3 % (ref 3.0–12.0)
Neutro Abs: 3 K/uL (ref 1.4–7.7)
Neutrophils Relative %: 58.9 % (ref 43.0–77.0)
Platelets: 272 K/uL (ref 150.0–400.0)
RBC: 4.48 Mil/uL (ref 3.87–5.11)
RDW: 13.1 % (ref 11.5–15.5)
WBC: 5.1 K/uL (ref 4.0–10.5)

## 2024-11-20 LAB — MAGNESIUM: Magnesium: 2.4 mg/dL (ref 1.5–2.5)

## 2024-11-20 LAB — TSH: TSH: 1.04 u[IU]/mL (ref 0.35–5.50)

## 2024-11-20 MED ORDER — LEVOCETIRIZINE DIHYDROCHLORIDE 5 MG PO TABS: 5.0000 mg | ORAL_TABLET | Freq: Every evening | ORAL | 2 refills | Status: AC

## 2024-11-20 NOTE — Progress Notes (Signed)
 Assessment & Plan Seasonal allergies Symptoms consistent with allergies, no infection signs. Differential includes early cold. Dry eyes considered but unlikely. - Prescribed Xyzal . - Continue Flonase  with varied nasal spray technique. Orders:   levocetirizine (XYZAL ) 5 MG tablet; Take 1 tablet (5 mg total) by mouth every evening.  Paroxysmal tachycardia (HCC) Reports of elevated heart rate, but normal on exam. Possible anxiety or decongestant use. No immediate cardiac concerns. - Ordered lab work for electrolytes, thyroid  function, and blood counts.  Orders:   Comprehensive metabolic panel with GFR; Future   CBC with Differential/Platelet; Future   TSH; Future   Magnesium; Future   Follow up plan: Return if symptoms worsen or fail to improve.  Niki Rung, MSN, APRN, FNP-C  Subjective:  HPI: Amanda Bernard is a 53 y.o. female presenting on 11/20/2024 for Sinus Problem (Facial pressure, nasal drip, watery eyes, slight dull HA/Started about 3 days ago ) and elevated heart rate (Noticed for a few days off/on )  Discussed the use of AI scribe software for clinical note transcription with the patient, who gave verbal consent to proceed.  She has been experiencing facial pressure, nasal drip, watery eyes, and a headache for the past three days. The sensation in her eyes feels as if they are 'going cross,' and they are watering but not itchy. She has a history of seasonal allergies and typically takes Zyrtec from March through October but has not been taking it recently. She resumed using Flonase  yesterday and started taking Mucinex liquid for head and chest congestion, though she is unsure of its effectiveness. Her mother suggested she might have dry eyes, as her mother had similar symptoms in the past. However, she notes that her eyes are not stringy or goopy, which she understands to be more typical of dry eyes. She recalls feeling 'swimmy headed' in the past when her allergies act  up, but she does not currently have this sensation.   She mentions feeling an elevated heart rate, describing it as 'going kind of fast,' though she is uncertain of the exact speed. She feels the sensation in her chest, even when her pulse feels normal. She identifies as a very anxious person and is unsure if the sensation is related to anxiety.      ROS: Negative unless specifically indicated above in HPI.   Relevant past medical history reviewed and updated as indicated.   Allergies and medications reviewed and updated.  Current Medications[1]  Allergies[2]  Objective:   BP 138/78   Pulse 60   Temp 97.6 F (36.4 C)   Resp 18   Ht 5' 5 (1.651 m)   Wt 210 lb (95.3 kg)   SpO2 99%   BMI 34.95 kg/m    Physical Exam Vitals reviewed.  Constitutional:      General: She is not in acute distress.    Appearance: Normal appearance. She is not ill-appearing, toxic-appearing or diaphoretic.  HENT:     Head: Normocephalic and atraumatic.     Right Ear: Tympanic membrane, ear canal and external ear normal. There is no impacted cerumen.     Left Ear: Tympanic membrane, ear canal and external ear normal. There is no impacted cerumen.     Nose: Nose normal. No congestion or rhinorrhea.     Right Sinus: No maxillary sinus tenderness or frontal sinus tenderness.     Left Sinus: No maxillary sinus tenderness or frontal sinus tenderness.     Mouth/Throat:     Mouth: Mucous  membranes are moist.     Pharynx: Oropharynx is clear. No oropharyngeal exudate or posterior oropharyngeal erythema.  Eyes:     General: No scleral icterus.       Right eye: No discharge.        Left eye: No discharge.     Conjunctiva/sclera: Conjunctivae normal.  Cardiovascular:     Rate and Rhythm: Normal rate and regular rhythm.     Heart sounds: Normal heart sounds. No murmur heard.    No friction rub. No gallop.  Pulmonary:     Effort: Pulmonary effort is normal. No respiratory distress.     Breath sounds:  Normal breath sounds. No stridor. No wheezing, rhonchi or rales.  Musculoskeletal:        General: Normal range of motion.     Cervical back: Normal range of motion.  Lymphadenopathy:     Cervical: No cervical adenopathy.  Skin:    General: Skin is warm and dry.     Capillary Refill: Capillary refill takes less than 2 seconds.  Neurological:     General: No focal deficit present.     Mental Status: She is alert and oriented to person, place, and time. Mental status is at baseline.  Psychiatric:        Mood and Affect: Mood normal.        Behavior: Behavior normal.        Thought Content: Thought content normal.        Judgment: Judgment normal.            [1]  Current Outpatient Medications:    B Complex Vitamins (VITAMIN B COMPLEX 100 IJ), Take 1 tablet by mouth daily. , Disp: , Rfl:    Cholecalciferol (VITAMIN D3 PO), Take 1 tablet by mouth daily., Disp: , Rfl:    COLLAGEN PO, Take 1 tablet by mouth daily. Peptides, Disp: , Rfl:    cyclobenzaprine  (FLEXERIL ) 5 MG tablet, Take 1 tablet (5 mg total) by mouth 3 (three) times daily as needed for muscle spasms., Disp: 30 tablet, Rfl: 1   fluticasone  (FLONASE  ALLERGY RELIEF) 50 MCG/ACT nasal spray, Place 2 sprays into both nostrils daily., Disp: 48 g, Rfl: 3   levocetirizine (XYZAL ) 5 MG tablet, Take 1 tablet (5 mg total) by mouth every evening., Disp: 30 tablet, Rfl: 2   minoxidil  (LONITEN ) 2.5 MG tablet, Take 1 tablet (2.5 mg total) by mouth daily., Disp: 90 tablet, Rfl: 3   Multiple Vitamins-Minerals (MULTIVITAMIN ADULT EXTRA C PO), Take 1 tablet by mouth daily. , Disp: , Rfl:    Pumpkin Seed 500 MG CAPS, Take 2 capsules by mouth daily., Disp: , Rfl:    Omega-3 Fatty Acids (FISH OIL) 1000 MG CPDR, Take 1 capsule by mouth daily., Disp: , Rfl:  [2] No Known Allergies

## 2024-11-27 ENCOUNTER — Ambulatory Visit: Payer: Self-pay

## 2024-11-27 NOTE — Telephone Encounter (Signed)
 FYI Only or Action Required?: FYI only for provider: appointment scheduled on 11/28/2024.  Patient was last seen in primary care on 11/20/2024 by Merlynn Niki FALCON, FNP.  Called Nurse Triage reporting Palpitations.  Symptoms began several weeks ago.  Interventions attempted: Rest, hydration, or home remedies.  Symptoms are: stable.  Triage Disposition: See PCP When Office is Open (Within 3 Days)  Patient/caregiver understands and will follow disposition?: Yes      1. DESCRIPTION: Please describe your heart rate or heartbeat that you are having (e.g., fast/slow, regular/irregular, skipped or extra beats, palpitations)     Feels fast , when checked last night 73  2. ONSET: When did it start? (e.g., minutes, hours, days)      1-2 weeks 3. DURATION: How long does it last (e.g., seconds, minutes, hours)     Feel like hearts beating fast all the time  6. HEART RATE: Can you tell me your heart rate? How many beats in 15 seconds?  Note: Not all patients can do this.       Checked last night was 73  7. RECURRENT SYMPTOM: Have you ever had this before? If Yes, ask: When was the last time? and What happened that time?      Mentioned this has been going on 1-2 weeks went to office 12/15 for allergy concerns and had mentioned this then 8. CAUSE: What do you think is causing the palpitations?     Unknown does not drink caffeine  9. CARDIAC HISTORY: Do you have any history of heart disease? (e.g., heart attack, angina, bypass surgery, angioplasty, arrhythmia)      Denies  10. OTHER SYMPTOMS: Do you have any other symptoms? (e.g., dizziness, chest pain, sweating, difficulty breathing)  When lies down can hear heartbeat in ears  Patient denies chest pain, difficulty breathing , dizziness ,syncope   Copied from CRM #[id number]. Topic: Clinical - Red Word Triage >> Nov 27, 2024  1:59 PM Suzen RAMAN wrote: Red Word that prompted transfer to Nurse Triage: fast heart beat;  requesting an appt Reason for Disposition  [1] Palpitations AND [2] no improvement after using Care Advice  Protocols used: Heart Rate and Heartbeat Questions-A-AH

## 2024-11-28 ENCOUNTER — Encounter: Payer: Self-pay | Admitting: Family Medicine

## 2024-11-28 ENCOUNTER — Ambulatory Visit: Admitting: Family Medicine

## 2024-11-28 VITALS — BP 118/78 | HR 68 | Temp 98.2°F | Resp 18 | Ht 65.0 in | Wt 209.0 lb

## 2024-11-28 DIAGNOSIS — F331 Major depressive disorder, recurrent, moderate: Secondary | ICD-10-CM | POA: Diagnosis not present

## 2024-11-28 DIAGNOSIS — R000 Tachycardia, unspecified: Secondary | ICD-10-CM

## 2024-11-28 DIAGNOSIS — G479 Sleep disorder, unspecified: Secondary | ICD-10-CM

## 2024-11-28 DIAGNOSIS — R0683 Snoring: Secondary | ICD-10-CM | POA: Diagnosis not present

## 2024-11-28 DIAGNOSIS — F411 Generalized anxiety disorder: Secondary | ICD-10-CM | POA: Diagnosis not present

## 2024-11-28 DIAGNOSIS — Z6835 Body mass index (BMI) 35.0-35.9, adult: Secondary | ICD-10-CM

## 2024-11-28 DIAGNOSIS — E66812 Obesity, class 2: Secondary | ICD-10-CM | POA: Diagnosis not present

## 2024-11-28 LAB — COMPREHENSIVE METABOLIC PANEL WITH GFR
ALT: 13 U/L (ref 3–35)
AST: 13 U/L (ref 5–37)
Albumin: 4.4 g/dL (ref 3.5–5.2)
Alkaline Phosphatase: 77 U/L (ref 39–117)
BUN: 11 mg/dL (ref 6–23)
CO2: 28 meq/L (ref 19–32)
Calcium: 9.3 mg/dL (ref 8.4–10.5)
Chloride: 104 meq/L (ref 96–112)
Creatinine, Ser: 0.63 mg/dL (ref 0.40–1.20)
GFR: 99.91 mL/min
Glucose, Bld: 101 mg/dL — ABNORMAL HIGH (ref 70–99)
Potassium: 3.8 meq/L (ref 3.5–5.1)
Sodium: 141 meq/L (ref 135–145)
Total Bilirubin: 0.4 mg/dL (ref 0.2–1.2)
Total Protein: 7.3 g/dL (ref 6.0–8.3)

## 2024-11-28 LAB — CBC WITH DIFFERENTIAL/PLATELET
Basophils Absolute: 0 K/uL (ref 0.0–0.1)
Basophils Relative: 0.9 % (ref 0.0–3.0)
Eosinophils Absolute: 0.1 K/uL (ref 0.0–0.7)
Eosinophils Relative: 2 % (ref 0.0–5.0)
HCT: 43.3 % (ref 36.0–46.0)
Hemoglobin: 14.4 g/dL (ref 12.0–15.0)
Lymphocytes Relative: 38.5 % (ref 12.0–46.0)
Lymphs Abs: 2.1 K/uL (ref 0.7–4.0)
MCHC: 33.3 g/dL (ref 30.0–36.0)
MCV: 92.5 fl (ref 78.0–100.0)
Monocytes Absolute: 0.4 K/uL (ref 0.1–1.0)
Monocytes Relative: 7.3 % (ref 3.0–12.0)
Neutro Abs: 2.8 K/uL (ref 1.4–7.7)
Neutrophils Relative %: 51.3 % (ref 43.0–77.0)
Platelets: 284 K/uL (ref 150.0–400.0)
RBC: 4.67 Mil/uL (ref 3.87–5.11)
RDW: 13.4 % (ref 11.5–15.5)
WBC: 5.5 K/uL (ref 4.0–10.5)

## 2024-11-28 LAB — D-DIMER, QUANTITATIVE: D-Dimer, Quant: <0.19 ug{FEU}/mL

## 2024-11-28 LAB — TROPONIN I (HIGH SENSITIVITY): High Sens Troponin I: 4 ng/L (ref 2–17)

## 2024-11-28 MED ORDER — ESCITALOPRAM OXALATE 5 MG PO TABS: ORAL_TABLET | ORAL | 1 refills | Status: AC

## 2024-11-28 NOTE — Progress Notes (Signed)
 "  Acute Office Visit  Subjective:     Patient ID: Amanda Bernard, female    DOB: 04-17-1971, 53 y.o.   MRN: [id number]  Chief Complaint  Patient presents with   Acute Visit    Feeling like heart has been pounding really strong hearing a blood beeting when laying down to sleep on side    HPI  Discussed the use of AI scribe software for clinical note transcription with the patient, who gave verbal consent to proceed.  History of Present Illness Amanda Bernard is a 53 year old female who presents with episodes of heart palpitations.  Cardiac symptoms - Daily episodes of heart palpitations for the past two weeks, described as pounding heartbeat - Symptoms resolved last night after a phone call and have not recurred - No current chest pain, shortness of breath, or dizziness - Brief non-painful twinge under breasts after speaking with nurse, did not persist - Labs last week including magnesium and thyroid  were normal - Blood sugar was 110 last week - No recent EKG  Anxiety and panic symptoms - History of anxiety and identifies as very worried in general - Prior panic attacks during a stressful period - Previously used Lexapro  with good effect and Xanax as needed  Sleep disturbance - Chronic poor sleep with waking multiple times per night - Typically sleeps about four hours at a time - Questions if sleep disturbance is related to menopause - Snores at night - No prior sleep study     ROS Per HPI      Objective:    BP 118/78 (BP Location: Left Arm, Patient Position: Sitting, Cuff Size: Normal)   Pulse 68   Temp 98.2 F (36.8 C) (Temporal)   Resp 18   Ht 5' 5 (1.651 m)   Wt 209 lb (94.8 kg)   SpO2 100%   BMI 34.78 kg/m    Physical Exam Vitals and nursing note reviewed.  Constitutional:      General: She is not in acute distress.    Appearance: Normal appearance. She is obese.  HENT:     Head: Normocephalic and atraumatic.     Right Ear: External ear  normal.     Left Ear: External ear normal.     Nose: Nose normal.     Mouth/Throat:     Mouth: Mucous membranes are moist.     Pharynx: Oropharynx is clear.  Eyes:     Extraocular Movements: Extraocular movements intact.     Pupils: Pupils are equal, round, and reactive to light.  Neck:     Vascular: No carotid bruit.  Cardiovascular:     Rate and Rhythm: Normal rate and regular rhythm.     Pulses: Normal pulses.     Heart sounds: Normal heart sounds.  Pulmonary:     Effort: Pulmonary effort is normal. No respiratory distress.     Breath sounds: Normal breath sounds. No wheezing, rhonchi or rales.  Musculoskeletal:        General: Normal range of motion.     Cervical back: Normal range of motion.     Right lower leg: No edema.     Left lower leg: No edema.  Lymphadenopathy:     Cervical: No cervical adenopathy.  Neurological:     General: No focal deficit present.     Mental Status: She is alert and oriented to person, place, and time.  Psychiatric:        Mood and Affect: Mood normal.  Thought Content: Thought content normal.    EKG: Reviewed and interpreted by me Indication: intermittent tachycardia  Rate: 75 Interpretation: no acute ischemia, junctional rhythm Changes from previous: none significant  Results for orders placed or performed in visit on 11/28/24  D-dimer, quantitative  Result Value Ref Range   D-Dimer, Quant <0.19 <0.50 mcg/mL FEU  Comp Met (CMET)  Result Value Ref Range   Sodium 141 135 - 145 mEq/L   Potassium 3.8 3.5 - 5.1 mEq/L   Chloride 104 96 - 112 mEq/L   CO2 28 19 - 32 mEq/L   Glucose, Bld 101 (H) 70 - 99 mg/dL   BUN 11 6 - 23 mg/dL   Creatinine, Ser 9.36 0.40 - 1.20 mg/dL   Total Bilirubin 0.4 0.2 - 1.2 mg/dL   Alkaline Phosphatase 77 39 - 117 U/L   AST 13 5 - 37 U/L   ALT 13 3 - 35 U/L   Total Protein 7.3 6.0 - 8.3 g/dL   Albumin 4.4 3.5 - 5.2 g/dL   GFR 00.08 >39.99 mL/min   Calcium 9.3 8.4 - 10.5 mg/dL  CBC w/Diff  Result  Value Ref Range   WBC 5.5 4.0 - 10.5 K/uL   RBC 4.67 3.87 - 5.11 Mil/uL   Hemoglobin 14.4 12.0 - 15.0 g/dL   HCT 56.6 63.9 - 53.9 %   MCV 92.5 78.0 - 100.0 fl   MCHC 33.3 30.0 - 36.0 g/dL   RDW 86.5 88.4 - 84.4 %   Platelets 284.0 150.0 - 400.0 K/uL   Neutrophils Relative % 51.3 43.0 - 77.0 %   Lymphocytes Relative 38.5 12.0 - 46.0 %   Monocytes Relative 7.3 3.0 - 12.0 %   Eosinophils Relative 2.0 0.0 - 5.0 %   Basophils Relative 0.9 0.0 - 3.0 %   Neutro Abs 2.8 1.4 - 7.7 K/uL   Lymphs Abs 2.1 0.7 - 4.0 K/uL   Monocytes Absolute 0.4 0.1 - 1.0 K/uL   Eosinophils Absolute 0.1 0.0 - 0.7 K/uL   Basophils Absolute 0.0 0.0 - 0.1 K/uL  Troponin I (High Sensitivity)  Result Value Ref Range   High Sens Troponin I 4 2 - 17 ng/L        Assessment & Plan:   Assessment and Plan Assessment & Plan Tachycardia Intermittent episodes without associated symptoms. Differential includes anxiety, electrolyte imbalance, or anemia. No acute cardiac issues suspected. - Ordered CBC and metabolic panel. - Will review previous EKG results.  Generalized anxiety disorder,MDD, mod, rec Chronic anxiety managed previously with Lexapro . Considering restarting due to ongoing symptoms. Discussed alcohol safety with Lexapro . - Prescribed Lexapro  5 mg, start with half tablet for first week, then one tablet daily. - Scheduled follow-up in one month.  Sleep disturbance with snoring, Obesity with BMI 35 Chronic disturbance with snoring, possible sleep apnea. Discussed home sleep study benefits. - Referral to sleep medicine     Orders Placed This Encounter  Procedures   CBC w/Diff    Standing Status:   Future    Number of Occurrences:   1    Expiration Date:   11/28/2025   Comp Met (CMET)    Standing Status:   Future    Number of Occurrences:   1    Expiration Date:   11/28/2025   D-dimer, quantitative    Standing Status:   Future    Number of Occurrences:   1    Expiration Date:   11/28/2025    Ambulatory referral to Neurology  Referral Priority:   Routine    Referral Type:   Consultation    Referral Reason:   Specialty Services Required    Requested Specialty:   Neurology    Number of Visits Requested:   1   EKG 12-Lead     Meds ordered this encounter  Medications   escitalopram  (LEXAPRO ) 5 MG tablet    Sig: Take 1/2 tab once daily for the first 7 days, then may take one tablet once daily.    Dispense:  30 tablet    Refill:  1    Return in about 4 weeks (around 12/26/2024) for med eval.  Corean LITTIE Ku, FNP  "

## 2024-11-28 NOTE — Patient Instructions (Addendum)
 EKG does not show anything acute.   I have sent in lexapro  5mg  to your pharmacy today. Start with half tablet for a week, then may take one tablet once daily afterward.   We are checking labs today, will be in contact with any results that require further attention.  Follow up with me in one month for evaluation of medication effectiveness.

## 2024-12-01 ENCOUNTER — Ambulatory Visit: Payer: Self-pay | Admitting: Family Medicine

## 2024-12-11 ENCOUNTER — Other Ambulatory Visit: Payer: Self-pay | Admitting: Internal Medicine
